# Patient Record
Sex: Female | Born: 1965 | Race: Black or African American | Hispanic: No
Health system: Midwestern US, Community
[De-identification: ages and names within clinical notes are randomized; demographics above are authoritative.]

## PROBLEM LIST (undated history)

## (undated) DIAGNOSIS — I10 Essential (primary) hypertension: Secondary | ICD-10-CM

## (undated) HISTORY — PX: HAND TENDON SURGERY: SHX663

## (undated) HISTORY — PX: TUBAL LIGATION: SHX77

---

## 2016-07-05 ENCOUNTER — Other Ambulatory Visit (HOSPITAL_BASED_OUTPATIENT_CLINIC_OR_DEPARTMENT_OTHER): Payer: Self-pay

## 2016-07-05 DIAGNOSIS — R0683 Snoring: Secondary | ICD-10-CM

## 2016-07-05 DIAGNOSIS — R5383 Other fatigue: Secondary | ICD-10-CM

## 2016-08-28 ENCOUNTER — Encounter (HOSPITAL_COMMUNITY): Payer: Self-pay | Admitting: Family Medicine

## 2016-08-28 ENCOUNTER — Ambulatory Visit (HOSPITAL_COMMUNITY)
Admission: EM | Admit: 2016-08-28 | Discharge: 2016-08-28 | Disposition: A | Discharge status: Home / Self Care | Payer: Managed Care, Other (non HMO) | Attending: Family Medicine | Admitting: Family Medicine

## 2016-08-28 DIAGNOSIS — K047 Periapical abscess without sinus: Secondary | ICD-10-CM

## 2016-08-28 DIAGNOSIS — R0981 Nasal congestion: Secondary | ICD-10-CM | POA: Diagnosis not present

## 2016-08-28 HISTORY — DX: Essential (primary) hypertension: I10

## 2016-08-28 MED ORDER — FLUCONAZOLE 150 MG PO TABS: 150.0000 mg | ORAL_TABLET | Freq: Once | ORAL | 0 refills | Status: AC

## 2016-08-28 MED ORDER — CLINDAMYCIN HCL 150 MG PO CAPS: 150.0000 mg | ORAL_CAPSULE | Freq: Three times a day (TID) | ORAL | 0 refills | Status: DC

## 2016-08-28 NOTE — ED Provider Notes (Signed)
[facility]    CSN: [id number] Arrival date & time: 08/28/16  1936     History   Chief Complaint Chief Complaint  Patient presents with  . Nasal Congestion    HPI Isabel Pierce is a 51 y.o. female.   [name]      Past Medical History:  Diagnosis Date  . Hypertension     There are no active problems to display for this patient.   Past Surgical History:  Procedure Laterality Date  . TUBAL LIGATION      OB History    No data available       Home Medications    Prior to Admission medications   Medication Sig Start Date End Date Taking? Authorizing Provider  clindamycin (CLEOCIN) 150 MG capsule Take 1 capsule (150 mg total) by mouth 3 (three) times daily. 08/28/16   Elvina Sidle, MD    Family History History reviewed. No pertinent family history.  Social History Social History  Substance Use Topics  . Smoking status: Not on file  . Smokeless tobacco: Not on file  . Alcohol use Not on file     Allergies   Augmentin [amoxicillin-pot clavulanate]   Review of Systems Review of Systems   Physical Exam Triage Vital Signs ED Triage Vitals [08/28/16 2029]  Enc Vitals Group     BP 124/78     Pulse Rate 78     Resp 18     Temp 98.6 F (37 C)     Temp src      SpO2 100 %     Weight      Height      Head Circumference      Peak Flow      Pain Score      Pain Loc      Pain Edu?      Excl. in GC?    No data found.   Updated Vital Signs BP 124/78   Pulse 78   Temp 98.6 F (37 C)   Resp 18   SpO2 100%      Physical Exam  Constitutional: She is oriented to person, place, and time. She appears well-developed and well-nourished.  HENT:  Right Ear: External ear normal.  Left Ear: External ear normal.  At the site of tooth #11 patient has a cavity in the gingiva that is filled with yellow pus. A similar colored adherent pus overlies  the gingiva on the roof of the palate on the left side adjacent to teeth #11 through 14  Eyes: Pupils are equal, round, and reactive to light. Conjunctivae are normal.  Neck: Normal range of motion.  Pulmonary/Chest: Effort normal.  Musculoskeletal: Normal range of motion.  Neurological: She is alert and oriented to person, place, and time.  Skin: Skin is warm and dry.  Nursing note and vitals reviewed.    UC Treatments / Results  Labs (all labs ordered are listed, but only abnormal results are displayed) Labs Reviewed - No data to display  EKG  EKG Interpretation None       Radiology No results found.  Procedures Procedures (including critical care time)  Medications Ordered in UC Medications - No data to display   Initial Impression / Assessment and Plan / UC Course  I have reviewed the triage vital signs and the nursing notes.  Pertinent labs & imaging results that were available during my care  of the patient were reviewed by me and considered in my medical decision making (see chart for details).     Final Clinical Impressions(s) / UC Diagnoses   Final diagnoses:  Dental infection  Sinus congestion    New Prescriptions New Prescriptions   CLINDAMYCIN (CLEOCIN) 150 MG CAPSULE    Take 1 capsule (150 mg total) by mouth 3 (three) times daily.     Controlled Substance Prescriptions [place] Controlled Substance Registry consulted? Not Applicable   Elvina SidleLauenstein, Amala Petion, MD 08/28/16 2109

## 2016-08-28 NOTE — ED Triage Notes (Signed)
Pt here for congestion and possible sinus infection.

## 2016-08-28 NOTE — Discharge Instructions (Signed)
You appear to have a dental infection at the site of the dental extraction as well as a palate infection of the same bacterial source. I would like you to call your dentist and make sure he extracted the entire root of the tooth, and follow-up with the dentist if there is any question of retained root.  Please return here or your dentist if symptoms persist

## 2017-03-02 ENCOUNTER — Ambulatory Visit (HOSPITAL_COMMUNITY)
Admission: EM | Admit: 2017-03-02 | Discharge: 2017-03-02 | Disposition: A | Discharge status: Home / Self Care | Payer: Managed Care, Other (non HMO) | Attending: Family Medicine | Admitting: Family Medicine

## 2017-03-02 ENCOUNTER — Telehealth (HOSPITAL_COMMUNITY): Payer: Self-pay | Admitting: Emergency Medicine

## 2017-03-02 ENCOUNTER — Encounter (HOSPITAL_COMMUNITY): Payer: Self-pay | Admitting: Emergency Medicine

## 2017-03-02 ENCOUNTER — Other Ambulatory Visit: Payer: Self-pay

## 2017-03-02 DIAGNOSIS — H9202 Otalgia, left ear: Secondary | ICD-10-CM

## 2017-03-02 DIAGNOSIS — K0889 Other specified disorders of teeth and supporting structures: Secondary | ICD-10-CM

## 2017-03-02 MED ORDER — FLUCONAZOLE 150 MG PO TABS: 150.0000 mg | ORAL_TABLET | Freq: Every day | ORAL | 1 refills | Status: DC

## 2017-03-02 MED ORDER — CLINDAMYCIN HCL 300 MG PO CAPS: 300.0000 mg | ORAL_CAPSULE | Freq: Three times a day (TID) | ORAL | 0 refills | Status: DC

## 2017-03-02 NOTE — ED Triage Notes (Signed)
Pt c/o pain in her L ear, with sharp pains in the L side of her upper jaw. Pt states shes had a tooth extracted and its hurting in the same area.

## 2017-03-09 NOTE — ED Provider Notes (Signed)
  [facility]   [id number] 03/02/17 Arrival Time: 1448  ASSESSMENT & PLAN:  1. Left ear pain   2. Pain, dental     Meds ordered this encounter  Medications  . clindamycin (CLEOCIN) 300 MG capsule    Sig: Take 1 capsule (300 mg total) by mouth 3 (three) times daily.    Dispense:  30 capsule    Refill:  0   Prefers OTC analgesics. Will schedule f/u with her dentist. May f/u here as needed.  Reviewed expectations re: course of current medical issues. Questions answered. Outlined signs and symptoms indicating need for more acute intervention. Patient verbalized understanding. After Visit Summary given.   SUBJECTIVE:  Isabel Pierce is a 52 y.o. female who reports gradual onset of left upper dental pain. Present for several days. Afebrile. Tolerating PO intake but reports pain with chewing. Normal swallowing. She does see a dentist regularly. No neck swelling or pain. OTC analgesics with some relief. Also with L ear discomfort. Occasional sharp pain. No drainage or bleeding. No fever reported.  ROS: As per HPI.  OBJECTIVE:  Vitals:   03/02/17 1547  BP: (!) 134/97  Pulse: 68  Resp: 18  Temp: 100.2 F (37.9 C)  SpO2: 100%    General appearance: alert; no distress HENT: normocephalic; atraumatic; dentition: fair; gingival hypertrophy over left upper gums; no areas of fluctuance; bilateral TMs appear normal Neck: supple without LAD Lungs: normal respirations Skin: warm and dry Psychological: alert and cooperative; normal mood and affect  Allergies  Allergen Reactions  . Augmentin [Amoxicillin-Pot Clavulanate]     Diarrhea   . Contrast Media [Iodinated Diagnostic Agents]     hives  . Lisinopril     cough    Past Medical History:  Diagnosis Date  . Hypertension    Social History   Socioeconomic History  . Marital status: Unknown    Spouse name: Not on file  . Number of children: Not on file  . Years of education: Not on file  . Highest education  level: Not on file  Social Needs  . Financial resource strain: Not on file  . Food insecurity - worry: Not on file  . Food insecurity - inability: Not on file  . Transportation needs - medical: Not on file  . Transportation needs - non-medical: Not on file  Occupational History  . Not on file  Tobacco Use  . Smoking status: Not on file  Substance and Sexual Activity  . Alcohol use: Not on file  . Drug use: Not on file  . Sexual activity: Not on file  Other Topics Concern  . Not on file  Social History Narrative  . Not on file    Past Surgical History:  Procedure Laterality Date  . TUBAL LIGATION       Mardella LaymanHagler, Adwoa Axe, MD 03/09/17 [phone]

## 2017-10-12 ENCOUNTER — Encounter (HOSPITAL_COMMUNITY): Payer: Self-pay | Admitting: Emergency Medicine

## 2017-10-12 ENCOUNTER — Ambulatory Visit (HOSPITAL_COMMUNITY)
Admission: EM | Admit: 2017-10-12 | Discharge: 2017-10-12 | Disposition: A | Discharge status: Home / Self Care | Payer: Managed Care, Other (non HMO) | Attending: Family Medicine | Admitting: Family Medicine

## 2017-10-12 ENCOUNTER — Other Ambulatory Visit: Payer: Self-pay

## 2017-10-12 DIAGNOSIS — Z79899 Other long term (current) drug therapy: Secondary | ICD-10-CM | POA: Diagnosis not present

## 2017-10-12 DIAGNOSIS — Z880 Allergy status to penicillin: Secondary | ICD-10-CM | POA: Diagnosis not present

## 2017-10-12 DIAGNOSIS — R002 Palpitations: Secondary | ICD-10-CM | POA: Diagnosis present

## 2017-10-12 DIAGNOSIS — E669 Obesity, unspecified: Secondary | ICD-10-CM | POA: Insufficient documentation

## 2017-10-12 DIAGNOSIS — I10 Essential (primary) hypertension: Secondary | ICD-10-CM | POA: Diagnosis not present

## 2017-10-12 LAB — POCT I-STAT, CHEM 8
BUN: 17 mg/dL (ref 6–20)
CHLORIDE: 100 mmol/L (ref 98–111)
Calcium, Ion: 1.18 mmol/L (ref 1.15–1.40)
Creatinine, Ser: 0.9 mg/dL (ref 0.44–1.00)
GLUCOSE: 95 mg/dL (ref 70–99)
HEMATOCRIT: 39 % (ref 36.0–46.0)
HEMOGLOBIN: 13.3 g/dL (ref 12.0–15.0)
POTASSIUM: 3.6 mmol/L (ref 3.5–5.1)
Sodium: 138 mmol/L (ref 135–145)
TCO2: 27 mmol/L (ref 22–32)

## 2017-10-12 LAB — TSH: TSH: 2.259 u[IU]/mL (ref 0.350–4.500)

## 2017-10-12 NOTE — Discharge Instructions (Signed)
EKG and blood work did not show concerning features Thyroid test ordered.  We will follow up with you if abnormal results Follow up with cardiologist tomorrow for further evaluation and management of palpitation Go to the ED if you have have any new or worsening symptoms such as chest pain, shortness of breath, nausea, vomiting, lightheadedness, dizziness, abdominal pain, jaw or arm numbness, etc..Marland Kitchen

## 2017-10-12 NOTE — ED Triage Notes (Addendum)
Pt reports a history of palpitations that started in June.  Pt had an echo and was seen by a cardiologist with no findings.  Pt states she started having palpitations again yesterday but she states that they feel different this time.  Pt denies any CP or SOB.

## 2017-10-12 NOTE — ED Provider Notes (Signed)
[facility]   [id number] 10/12/17 Arrival Time: 1836   CC: Palpitations  SUBJECTIVE:  Isabel Pierce is a 52 y.o. female hx of HTN, and obesity who presents with complaint of palpitations that began yesterday and today.  Reports similar symptoms in June of this year and was seen by a cardiologist.  ECHO was performed at that time and was normal.  Patient states palpitations lasts for a few minutes at a time and occur approximately 5x a day.  Denies a precipitating event, or known trigger.  Denies alleviating or aggravating factors.  Denies chest pain, SOB, dizziness, lightheadedness, nausea, vomiting, swelling in lower extremities, and abdominal pain.    ROS: As per HPI. Past Medical History:  Diagnosis Date  . Hypertension    Past Surgical History:  Procedure Laterality Date  . TUBAL LIGATION     Allergies  Allergen Reactions  . Augmentin [Amoxicillin-Pot Clavulanate]     Diarrhea   . Contrast Media [Iodinated Diagnostic Agents]     hives  . Lisinopril     cough   No current facility-administered medications on file prior to encounter.    Current Outpatient Medications on File Prior to Encounter  Medication Sig Dispense Refill  . amLODipine (NORVASC) 5 MG tablet Take 5 mg by mouth daily.    . carvedilol (COREG) 12.5 MG tablet Take 12.5 mg by mouth 2 (two) times daily with a meal.    . hydrochlorothiazide (HYDRODIURIL) 12.5 MG tablet Take 12.5 mg by mouth daily.    . ranitidine (ZANTAC) 150 MG tablet Take 150 mg by mouth 2 (two) times daily.     Social History   Socioeconomic History  . Marital status: Married    Spouse name: Not on file  . Number of children: Not on file  . Years of education: Not on file  . Highest education level: Not on file  Occupational History  . Not on file  Social Needs  . Financial resource strain: Not on file  . Food insecurity:    Worry: Not on file    Inability: Not on file  . Transportation needs:    Medical: Not on file      Non-medical: Not on file  Tobacco Use  . Smoking status: Not on file  Substance and Sexual Activity  . Alcohol use: Not on file  . Drug use: Not on file  . Sexual activity: Not on file  Lifestyle  . Physical activity:    Days per week: Not on file    Minutes per session: Not on file  . Stress: Not on file  Relationships  . Social connections:    Talks on phone: Not on file    Gets together: Not on file    Attends religious service: Not on file    Active member of club or organization: Not on file    Attends meetings of clubs or organizations: Not on file    Relationship status: Not on file  . Intimate partner violence:    Fear of current or ex partner: Not on file    Emotionally abused: Not on file    Physically abused: Not on file    Forced sexual activity: Not on file  Other Topics Concern  . Not on file  Social History Narrative  . Not on file   History reviewed. No pertinent family history.   OBJECTIVE:  Vitals:   10/12/17 1852  BP: (!) 141/75  Pulse: 67  Temp: 98.2 F (36.8 C)  TempSrc: Oral  SpO2: 100%    General appearance: alert; no distress Eyes: PERRLA; EOMI; conjunctiva normal HENT: normocephalic; atraumatic Neck: supple Lungs: clear to auscultation bilaterally without adventitious breath sounds Heart: regular rate and rhythm.  Clear S1 and S2 without rubs, gallops, or murmur. Chest Wall:nontender with AP compression; no heaves, lifts, or thrills Abdomen: soft, non-tender; bowel sounds normal; no masses or organomegaly; no guarding Extremities: no cyanosis or edema; symmetrical with no gross deformities Skin: warm and dry Psychological: alert and cooperative; normal mood and affect  ECG: Orders placed or performed during the hospital encounter of 10/12/17  . ED EKG  . ED EKG   EKG showed sinus arrhythm without ST elevations, depressions, or prolonged PR interval.  No narrowing or widening of the QRS complexes.  Reviewed EKG with Dr. Tracie Harrier.   No past EKG on file for comparison.  Patient was not actively having symptoms when EKG performed.    Labs: Results for orders placed or performed during the hospital encounter of 10/12/17  I-STAT, chem 8  Result Value Ref Range   Sodium 138 135 - 145 mmol/L   Potassium 3.6 3.5 - 5.1 mmol/L   Chloride 100 98 - 111 mmol/L   BUN 17 6 - 20 mg/dL   Creatinine, Ser 1.61 0.44 - 1.00 mg/dL   Glucose, Bld 95 70 - 99 mg/dL   Calcium, Ion 0.96 0.45 - 1.40 mmol/L   TCO2 27 22 - 32 mmol/L   Hemoglobin 13.3 12.0 - 15.0 g/dL   HCT 40.9 81.1 - 91.4 %   Labs Reviewed  TSH  POCT I-STAT, CHEM 8    ASSESSMENT & PLAN:  1. Palpitations     No orders of the defined types were placed in this encounter.  EKG and blood work did not show concerning features Thyroid test ordered.  We will follow up with you if abnormal results Follow up with cardiologist tomorrow for further evaluation and management of palpitation Go to the ED if you have have any new or worsening symptoms such as chest pain, shortness of breath, nausea, vomiting, lightheadedness, dizziness, abdominal pain, jaw or arm numbness, etc...  Chest pain precautions given. Reviewed expectations re: course of current medical issues. Questions answered. Outlined signs and symptoms indicating need for more acute intervention. Patient verbalized understanding. After Visit Summary given.   Rennis Harding, PA-C 10/12/17 1945

## 2018-05-19 ENCOUNTER — Ambulatory Visit (INDEPENDENT_AMBULATORY_CARE_PROVIDER_SITE_OTHER): Payer: Managed Care, Other (non HMO)

## 2018-05-19 ENCOUNTER — Other Ambulatory Visit: Payer: Self-pay

## 2018-05-19 ENCOUNTER — Encounter (HOSPITAL_COMMUNITY): Payer: Self-pay | Admitting: Emergency Medicine

## 2018-05-19 ENCOUNTER — Ambulatory Visit (HOSPITAL_COMMUNITY)
Admission: EM | Admit: 2018-05-19 | Discharge: 2018-05-19 | Disposition: A | Discharge status: Home / Self Care | Payer: Managed Care, Other (non HMO) | Attending: Family Medicine | Admitting: Family Medicine

## 2018-05-19 DIAGNOSIS — R0602 Shortness of breath: Secondary | ICD-10-CM | POA: Diagnosis not present

## 2018-05-19 DIAGNOSIS — T7840XA Allergy, unspecified, initial encounter: Secondary | ICD-10-CM | POA: Diagnosis not present

## 2018-05-19 NOTE — ED Triage Notes (Signed)
Pt sts SOB x 3 days; pt sts some sneezing and sinus congestion; denies cough

## 2018-05-19 NOTE — Discharge Instructions (Addendum)
Your chest x-ray and EKG were normal Some of the symptoms you are experiencing could be from allergies I am not seeing anything concerning on exam.  Your  vital signs are stable. Continue taking the Flonase daily and I would recommend adding in a Zyrtec daily If your symptoms worsen to include more shortness of breath, chest pain you need to go to the ER for further evaluation.

## 2018-05-19 NOTE — ED Provider Notes (Signed)
[facility]    CSN: [id number] Arrival date & time: 05/19/18  1108     History   Chief Complaint Chief Complaint  Patient presents with  . Shortness of Breath    HPI Isabel Pierce is a 53 y.o. female.   Patient is a 53 year old female with past medical history of hypertension.  She presents today with approximately 3 days of shortness of breath.  The shortness of breath is worse when she has been talking for extended periods of time.  She is also had some sneezing and sinus congestion.  Denies any associated cough, chest congestion or fevers.  She takes Flonase daily for her allergies.  Patient denies any history of heart or lung issues.  Denies any history of PE, DVT, hormone use, recent long distance traveling.  No lower extremity edema.  Denies any recent sick contacts or recent traveling.  ROS per HPI      Past Medical History:  Diagnosis Date  . Hypertension     There are no active problems to display for this patient.   Past Surgical History:  Procedure Laterality Date  . TUBAL LIGATION      OB History   No obstetric history on file.      Home Medications    Prior to Admission medications   Medication Sig Start Date End Date Taking? Authorizing Provider  amLODipine (NORVASC) 5 MG tablet Take 5 mg by mouth daily.    [provider]  carvedilol (COREG) 12.5 MG tablet Take 12.5 mg by mouth 2 (two) times daily with a meal.    [provider]  hydrochlorothiazide (HYDRODIURIL) 12.5 MG tablet Take 12.5 mg by mouth daily.    [provider]  ranitidine (ZANTAC) 150 MG tablet Take 150 mg by mouth 2 (two) times daily.    [provider]    Family History History reviewed. No pertinent family history.  Social History Social History   Tobacco Use  . Smoking status: Not on file  Substance Use Topics  . Alcohol use: Not on file  . Drug use: Not on file     Allergies   Augmentin [amoxicillin-pot clavulanate];  Contrast media [iodinated diagnostic agents]; and Lisinopril   Review of Systems Review of Systems   Physical Exam Triage Vital Signs ED Triage Vitals  Enc Vitals Group     BP 05/19/18 1127 139/90     Pulse Rate 05/19/18 1127 64     Resp 05/19/18 1127 18     Temp 05/19/18 1127 98.4 F (36.9 C)     Temp Source 05/19/18 1127 Oral     SpO2 05/19/18 1127 99 %     Weight --      Height --      Head Circumference --      Peak Flow --      Pain Score 05/19/18 1128 0     Pain Loc --      Pain Edu? --      Excl. in GC? --    No data found.  Updated Vital Signs BP 139/90 (BP Location: Right Arm)   Pulse 64   Temp 98.4 F (36.9 C) (Oral)   Resp 18   SpO2 99%   Visual Acuity Right Eye Distance:   Left Eye Distance:   Bilateral Distance:    Right Eye Near:   Left Eye Near:    Bilateral Near:     Physical Exam Vitals signs and nursing note reviewed.  Constitutional:      General: She is not in acute distress.    Appearance: She is well-developed. She is not ill-appearing, toxic-appearing or diaphoretic.  HENT:     Head: Normocephalic and atraumatic.     Mouth/Throat:     Pharynx: Oropharynx is clear.  Neck:     Musculoskeletal: Normal range of motion.  Cardiovascular:     Rate and Rhythm: Normal rate and regular rhythm.  Pulmonary:     Effort: Pulmonary effort is normal.     Breath sounds: Normal breath sounds.  Musculoskeletal: Normal range of motion.     Right lower leg: She exhibits no tenderness. No edema.     Left lower leg: She exhibits no tenderness. No edema.  Skin:    General: Skin is warm and dry.  Neurological:     Mental Status: She is alert.  Psychiatric:        Mood and Affect: Mood normal.      UC Treatments / Results  Labs (all labs ordered are listed, but only abnormal results are displayed) Labs Reviewed - No data to display  EKG None  Radiology Dg Chest 2 View  Result Date: 05/19/2018 CLINICAL DATA:  Shortness of breath. EXAM:  CHEST - 2 VIEW COMPARISON:  None. FINDINGS: The heart size and mediastinal contours are within normal limits. Both lungs are clear. The visualized skeletal structures are unremarkable. IMPRESSION: Normal exam. Electronically Signed   By: Francene Boyers M.D.   On: 05/19/2018 12:17    Procedures Procedures (including critical care time)  Medications Ordered in UC Medications - No data to display  Initial Impression / Assessment and Plan / UC Course  I have reviewed the triage vital signs and the nursing notes.  Pertinent labs & imaging results that were available during my care of the patient were reviewed by me and considered in my medical decision making (see chart for details).     Patient is a 53 year old female with a past medical history of hypertension. She is presenting today with shortness of breath upon talking for long periods of time.  She does have a history of allergies and is also had some sinus congestion and sneezing. She is denying any other concerning symptoms. Exam is normal.  Vital signs are stable and she is nontoxic or ill-appearing. Chest x-ray was normal and EKG revealed sinus bradycardia with arrhythmia.  Patient is on beta-blocker. Normal saturations. Patient symptoms could be due to allergies. We will have her continue her Flonase daily and add Zyrtec daily for symptoms Strict precautions and instructions that if her symptoms continue or worsen she will need to go to the hospital for further evaluation and management There is no concern today for ACS, DVT, PE or any other concerning etiology. Patient understanding and agree. Final Clinical Impressions(s) / UC Diagnoses   Final diagnoses:  Shortness of breath  Allergic state, initial encounter     Discharge Instructions     Your chest x-ray and EKG were normal Some of the symptoms you are experiencing could be from allergies I am not seeing anything concerning on exam.  Your  vital signs are stable.  Continue taking the Flonase daily and I would recommend adding in a Zyrtec daily If your symptoms worsen to include more shortness of breath, chest pain you need to go to the ER for further evaluation.     ED Prescriptions    None     Controlled Substance Prescriptions [place] Controlled Substance Registry  consulted? Not Applicable   Janace ArisBast, Vedanshi Massaro A, NP 05/19/18 1236

## 2018-10-18 ENCOUNTER — Ambulatory Visit
Admission: RE | Admit: 2018-10-18 | Discharge: 2018-10-18 | Disposition: A | Discharge status: Home / Self Care | Payer: Managed Care, Other (non HMO) | Source: Ambulatory Visit | Attending: Physician Assistant | Admitting: Physician Assistant

## 2018-10-18 ENCOUNTER — Other Ambulatory Visit: Payer: Self-pay | Admitting: Physician Assistant

## 2018-10-18 DIAGNOSIS — R1084 Generalized abdominal pain: Secondary | ICD-10-CM

## 2018-10-18 DIAGNOSIS — K5900 Constipation, unspecified: Secondary | ICD-10-CM

## 2018-11-29 ENCOUNTER — Other Ambulatory Visit: Payer: Self-pay

## 2018-11-29 ENCOUNTER — Encounter (HOSPITAL_COMMUNITY): Payer: Self-pay

## 2018-11-29 ENCOUNTER — Ambulatory Visit (HOSPITAL_COMMUNITY)
Admission: EM | Admit: 2018-11-29 | Discharge: 2018-11-29 | Disposition: A | Discharge status: Home / Self Care | Payer: Managed Care, Other (non HMO) | Attending: Family Medicine | Admitting: Family Medicine

## 2018-11-29 DIAGNOSIS — M79601 Pain in right arm: Secondary | ICD-10-CM | POA: Diagnosis not present

## 2018-11-29 DIAGNOSIS — R202 Paresthesia of skin: Secondary | ICD-10-CM

## 2018-11-29 DIAGNOSIS — M25511 Pain in right shoulder: Secondary | ICD-10-CM

## 2018-11-29 MED ORDER — MELOXICAM 7.5 MG PO TABS: 7.5000 mg | ORAL_TABLET | Freq: Every day | ORAL | 0 refills | Status: DC

## 2018-11-29 MED ORDER — PREDNISONE 10 MG (21) PO TBPK: ORAL_TABLET | Freq: Every day | ORAL | 0 refills | Status: DC

## 2018-11-29 NOTE — ED Triage Notes (Signed)
Pt presents to UC w/ c/o right arm weakness, numbness, and pain x2 weeks. Pt states she uses her right hand to type a lot. Strength is equal in both arms.

## 2018-11-30 NOTE — ED Provider Notes (Signed)
[facility]   [id number] 11/29/18 Arrival Time: 1332  ASSESSMENT & PLAN:  1. Right arm pain   2. Acute pain of right shoulder   3. Arm paresthesia, right     No indication for plain imaging at this time. Discussed.  Begin trial of: Meds ordered this encounter  Medications  . predniSONE (STERAPRED UNI-PAK 21 TAB) 10 MG (21) TBPK tablet    Sig: Take by mouth daily. Take as directed.    Dispense:  21 tablet    Refill:  0  . meloxicam (MOBIC) 7.5 MG tablet    Sig: Take 1 tablet (7.5 mg total) by mouth daily.    Dispense:  14 tablet    Refill:  0  Encourage ROM as tolerated.  Recommend: Follow-up Information    [facility].   Why: If worsening or failing to improve as anticipated. Contact information: [address] [place] [address] [phone]          Reviewed expectations re: course of current medical issues. Questions answered. Outlined signs and symptoms indicating need for more acute intervention. Patient verbalized understanding. After Visit Summary given.  SUBJECTIVE: History from: patient. Isabel Pierce is a 53 y.o. female who reports intermittent mild to moderate pain of her right arm and shoulder; described as aching; without radiation. Onset: gradual. First noted: two w ago. Injury/trama: no. Symptoms have waxed and waned but are worse overall since beginning. Aggravating factors: certain movements and grasping. Alleviating factors: rest. Associated symptoms: none reported. Extremity sensation changes or weakness: occasional "tingling" in arm over elbow and forearm; none in wrist/hand. Self treatment: acetaminophen without much relief. History of similar: no.  Past Surgical History:  Procedure Laterality Date  . TUBAL LIGATION       ROS: As per HPI. All other systems negative.    OBJECTIVE:  Vitals:   11/29/18 1412  BP: (!) 141/71  Pulse: (!) 55  Resp: 16  Temp: 98.3 F  (36.8 C)  TempSrc: Oral  SpO2: 98%    General appearance: alert; no distress HEENT: [place]; AT Neck: supple with FROM; no midline TTP CV: bradycardia (her baseline); regular Resp: unlabored respirations Extremities: . RUE: warm with well perfused appearance; poorly localized mild tenderness over right shoulder without bony tenderness; without gross deformities; swelling: none; bruising: none; shoulder and elbow ROM: normal without reported discomfort CV: brisk extremity capillary refill of bilateral UE; 2+ radial pulse of bilateral UE. Skin: warm and dry; no visible rashes Neurologic: gait normal; normal reflexes of bilateral UE; normal sensation of bilateral UE; normal strength of bilateral UE Psychological: alert and cooperative; normal mood and affect    Allergies  Allergen Reactions  . Augmentin [Amoxicillin-Pot Clavulanate]     Diarrhea   . Contrast Media [Iodinated Diagnostic Agents]     hives  . Lisinopril     cough    Past Medical History:  Diagnosis Date  . Hypertension    Social History   Socioeconomic History  . Marital status: Married    Spouse name: Not on file  . Number of children: Not on file  . Years of education: Not on file  . Highest education level: Not on file  Occupational History  . Not on file  Social Needs  . Financial resource strain: Not on file  . Food insecurity    Worry: Not on file    Inability: Not on file  . Transportation needs    Medical: Not on  file    Non-medical: Not on file  Tobacco Use  . Smoking status: Never Smoker  . Smokeless tobacco: Never Used  Substance and Sexual Activity  . Alcohol use: Yes    Frequency: Never    Comment: occ  . Drug use: Never  . Sexual activity: Not on file  Lifestyle  . Physical activity    Days per week: Not on file    Minutes per session: Not on file  . Stress: Not on file  Relationships  . Social Herbalist on phone: Not on file    Gets together: Not on file    Attends  religious service: Not on file    Active member of club or organization: Not on file    Attends meetings of clubs or organizations: Not on file    Relationship status: Not on file  Other Topics Concern  . Not on file  Social History Narrative  . Not on file   Family History  Problem Relation Age of Onset  . Diabetes Mother   . Hypertension Mother   . Seizures Mother   . Cancer Father    Past Surgical History:  Procedure Laterality Date  . TUBAL LIGATION        Vanessa Kick, MD 11/30/18 1007

## 2019-05-16 ENCOUNTER — Ambulatory Visit
Admission: EM | Admit: 2019-05-16 | Discharge: 2019-05-16 | Disposition: A | Discharge status: Home / Self Care | Payer: Managed Care, Other (non HMO) | Attending: Emergency Medicine | Admitting: Emergency Medicine

## 2019-05-16 ENCOUNTER — Other Ambulatory Visit: Payer: Self-pay

## 2019-05-16 ENCOUNTER — Encounter: Payer: Self-pay | Admitting: Emergency Medicine

## 2019-05-16 DIAGNOSIS — N644 Mastodynia: Secondary | ICD-10-CM | POA: Diagnosis not present

## 2019-05-16 DIAGNOSIS — M5442 Lumbago with sciatica, left side: Secondary | ICD-10-CM

## 2019-05-16 DIAGNOSIS — M5441 Lumbago with sciatica, right side: Secondary | ICD-10-CM

## 2019-05-16 MED ORDER — NAPROXEN 500 MG PO TABS: 500.0000 mg | ORAL_TABLET | Freq: Two times a day (BID) | ORAL | 0 refills | Status: AC

## 2019-05-16 NOTE — ED Provider Notes (Addendum)
[name]    CSN: [id number] Arrival date & time: 05/16/19  1242      History   Chief Complaint Chief Complaint  Patient presents with  . Back Pain    HPI Isabel Pierce is a 54 y.o. female with history of hypertension, obesity presenting for evaluation of right-sided low back pain.  States that sometimes she will get radiation down both legs.  Prolonged standing and certain movements exacerbate this.  Denies recent fall or trauma.  No change in bowel or bladder habit.  Denying fever, chest pain, difficulty breathing.  Has taking single dose Aleve with some relief. Patient also notes chronic right breast pain.  Patient states she does feel 2 lumps in affected breast, anteromedial aspect.  No change in appearance of breast, nipple, nipple discharge or inversion.  Denies personal history of breast cancer.  Believes her cousin and sister may have had it.  Last mammogram was in July 2020: Reportedly normal.  Followed routinely by her PCP for health screenings.  No change in appetite, weight.   Past Medical History:  Diagnosis Date  . Hypertension     There are no problems to display for this patient.   Past Surgical History:  Procedure Laterality Date  . TUBAL LIGATION      OB History   No obstetric history on file.      Home Medications    Prior to Admission medications   Medication Sig Start Date End Date Taking? Authorizing Provider  amLODipine (NORVASC) 5 MG tablet Take 5 mg by mouth daily.    [provider]  carvedilol (COREG) 12.5 MG tablet Take 12.5 mg by mouth 2 (two) times daily with a meal.    [provider]  fluticasone (FLONASE) 50 MCG/ACT nasal spray Place into both nostrils daily.    [provider]  hydrochlorothiazide (HYDRODIURIL) 12.5 MG tablet Take 12.5 mg by mouth daily.    [provider]  ibuprofen (ADVIL) 800 MG tablet Take 800 mg by mouth every 8 (eight) hours as needed.    [provider]    naproxen (NAPROSYN) 500 MG tablet Take 1 tablet (500 mg total) by mouth 2 (two) times daily. 05/16/19   Hall-Potvin, [place], PA-C  ranitidine (ZANTAC) 150 MG tablet Take 150 mg by mouth 2 (two) times daily.  11/29/18  [provider]    Family History Family History  Problem Relation Age of Onset  . Diabetes Mother   . Hypertension Mother   . Seizures Mother   . Cancer Father     Social History Social History   Tobacco Use  . Smoking status: Never Smoker  . Smokeless tobacco: Never Used  Substance Use Topics  . Alcohol use: Yes    Comment: occ  . Drug use: Never     Allergies   Augmentin [amoxicillin-pot clavulanate], Contrast media [iodinated diagnostic agents], and Lisinopril   Review of Systems As per HPI   Physical Exam Triage Vital Signs ED Triage Vitals [05/16/19 1301]  Enc Vitals Group     BP      Pulse      Resp      Temp      Temp src      SpO2      Weight      Height      Head Circumference      Peak Flow      Pain Score 7     Pain Loc  Pain Edu?      Excl. in GC?    No data found.  Updated Vital Signs BP (!) 148/88 (BP Location: Right Arm)   Pulse 67   Temp 98.1 F (36.7 C)   Resp 20   SpO2 95%   Visual Acuity Right Eye Distance:   Left Eye Distance:   Bilateral Distance:    Right Eye Near:   Left Eye Near:    Bilateral Near:     Physical Exam Constitutional:      General: She is not in acute distress. HENT:     Head: Normocephalic and atraumatic.  Eyes:     General: No scleral icterus.    Pupils: Pupils are equal, round, and reactive to light.  Cardiovascular:     Rate and Rhythm: Normal rate.  Pulmonary:     Effort: Pulmonary effort is normal.  Chest:     Comments: Patient declined Musculoskeletal:     Comments: Lumbar spine without bony deformity or spinous process tenderness.  Patient does not have PSIS tenderness, though does have diffuse right-sided low back pain with palpation.  No crepitus,  edema, fluctuance or spasm appreciated.  Strength 5/5 in lower extremities with 2+ DTRs.  Gait normal.  Skin:    Coloration: Skin is not jaundiced or pale.  Neurological:     Mental Status: She is alert and oriented to person, place, and time.      UC Treatments / Results  Labs (all labs ordered are listed, but only abnormal results are displayed) Labs Reviewed - No data to display  EKG   Radiology No results found.  Procedures Procedures (including critical care time)  Medications Ordered in UC Medications - No data to display  Initial Impression / Assessment and Plan / UC Course  I have reviewed the triage vital signs and the nursing notes.  Pertinent labs & imaging results that were available during my care of the patient were reviewed by me and considered in my medical decision making (see chart for details).     Patient afebrile, nontoxic in office today.  H&P consistent with acute right low back pain with sciatica.  Will increase naproxen to 500 mg twice daily x1 week.  Stretches provided, will push fluids.  Regarding right breast tenderness.  No alarm symptoms as mentioned in HPI.  Patient declined exam today if she will follow up with her PCP for possible diagnostic mammogram.  Return precautions discussed, patient verbalized understanding and is agreeable to plan. Final Clinical Impressions(s) / UC Diagnoses   Final diagnoses:  Breast pain, right  Acute right-sided low back pain with bilateral sciatica     Discharge Instructions     Take naproxen twice daily with food x1 week. Important to drink plenty of water, do low back exercises provided. Go to ER for worsening pain, numbness/tingling, inability to urinate, or inability to hold in stool. Schedule follow-up appointment with PCP for reevaluation of right breast pain as further imaging may be needed.    ED Prescriptions    Medication Sig Dispense Auth. Provider   naproxen (NAPROSYN) 500 MG tablet Take 1  tablet (500 mg total) by mouth 2 (two) times daily. 30 tablet Hall-Potvin, [place], PA-C     I have reviewed the PDMP during this encounter.   Hall-Potvin, [place], PA-C 05/16/19 1534    Hall-Potvin, [place], [place] 05/16/19 1535

## 2019-05-16 NOTE — ED Triage Notes (Signed)
Pain in right breast for several months.  pcp evaluated breast pain and said it was nothing to worry about For the last week or 2 this pain has worsened.  Described as sharp  Lower back pain and pain radiating down both legs.  This pain has been intermittent, but has worsened in the last week.  Reports burning in back and numbness and tingling in both legs

## 2019-05-16 NOTE — Discharge Instructions (Addendum)
Take naproxen twice daily with food x1 week. Important to drink plenty of water, do low back exercises provided. Go to ER for worsening pain, numbness/tingling, inability to urinate, or inability to hold in stool. Schedule follow-up appointment with PCP for reevaluation of right breast pain as further imaging may be needed.

## 2019-07-29 ENCOUNTER — Other Ambulatory Visit: Payer: Self-pay

## 2019-07-29 ENCOUNTER — Ambulatory Visit
Admission: EM | Admit: 2019-07-29 | Discharge: 2019-07-29 | Disposition: A | Discharge status: Home / Self Care | Payer: Managed Care, Other (non HMO) | Attending: Physician Assistant | Admitting: Physician Assistant

## 2019-07-29 DIAGNOSIS — Z20828 Contact with and (suspected) exposure to other viral communicable diseases: Secondary | ICD-10-CM | POA: Diagnosis not present

## 2019-07-29 DIAGNOSIS — R059 Cough, unspecified: Secondary | ICD-10-CM

## 2019-07-29 DIAGNOSIS — J3489 Other specified disorders of nose and nasal sinuses: Secondary | ICD-10-CM

## 2019-07-29 DIAGNOSIS — R0981 Nasal congestion: Secondary | ICD-10-CM | POA: Diagnosis not present

## 2019-07-29 MED ORDER — IPRATROPIUM BROMIDE 0.06 % NA SOLN: 2.0000 | Freq: Four times a day (QID) | NASAL | 0 refills | Status: AC

## 2019-07-29 MED ORDER — BENZONATATE 200 MG PO CAPS: 200.0000 mg | ORAL_CAPSULE | Freq: Three times a day (TID) | ORAL | 0 refills | Status: AC

## 2019-07-29 NOTE — Discharge Instructions (Signed)
COVID PCR testing ordered. I would like you to quarantine until testing results. Tessalon for cough. Start flonase, atrovent nasal spray for nasal congestion/drainage. You can use over the counter nasal saline rinse such as neti pot for nasal congestion. Keep hydrated, your urine should be clear to pale yellow in color. Tylenol/motrin for fever and pain. Monitor for any worsening of symptoms, chest pain, shortness of breath, wheezing, swelling of the throat, go to the emergency department for further evaluation needed.   

## 2019-07-29 NOTE — ED Provider Notes (Signed)
[name]    CSN: [id number] Arrival date & time: 07/29/19  1323      History   Chief Complaint Chief Complaint  Patient presents with  . Nasal Congestion    HPI Isabel Pierce is a 54 y.o. female.   54 year old female comes in for 2 day of URI symptoms. Nasal congestion, rhinorrhea, intermittent cough, throat irritation. Denies fever, chills, body aches. Denies abdominal pain, nausea, vomiting, diarrhea. Denies shortness of breath. Had COVID 02/2019, still with loss of taste. otc cold medicine without relief.      Past Medical History:  Diagnosis Date  . Hypertension     There are no problems to display for this patient.   Past Surgical History:  Procedure Laterality Date  . TUBAL LIGATION      OB History   No obstetric history on file.      Home Medications    Prior to Admission medications   Medication Sig Start Date End Date Taking? Authorizing Provider  amLODipine (NORVASC) 5 MG tablet Take 5 mg by mouth daily.    [provider]  benzonatate (TESSALON) 200 MG capsule Take 1 capsule (200 mg total) by mouth every 8 (eight) hours. 07/29/19   Cathie Hoops, Farzad Tibbetts V, PA-C  carvedilol (COREG) 12.5 MG tablet Take 12.5 mg by mouth 2 (two) times daily with a meal.    [provider]  fluticasone (FLONASE) 50 MCG/ACT nasal spray Place into both nostrils daily.    [provider]  hydrochlorothiazide (HYDRODIURIL) 12.5 MG tablet Take 12.5 mg by mouth daily.    [provider]  ibuprofen (ADVIL) 800 MG tablet Take 800 mg by mouth every 8 (eight) hours as needed.    [provider]  ipratropium (ATROVENT) 0.06 % nasal spray Place 2 sprays into both nostrils 4 (four) times daily. 07/29/19   Cathie Hoops, Hideko Esselman V, PA-C  naproxen (NAPROSYN) 500 MG tablet Take 1 tablet (500 mg total) by mouth 2 (two) times daily. 05/16/19   Hall-Potvin, [place], PA-C  ranitidine (ZANTAC) 150 MG tablet Take 150 mg by mouth 2 (two) times daily.  11/29/18   [provider]    Family History Family History  Problem Relation Age of Onset  . Diabetes Mother   . Hypertension Mother   . Seizures Mother   . Cancer Father     Social History Social History   Tobacco Use  . Smoking status: Never Smoker  . Smokeless tobacco: Never Used  Substance Use Topics  . Alcohol use: Yes    Comment: occ  . Drug use: Never     Allergies   Augmentin [amoxicillin-pot clavulanate], Contrast media [iodinated diagnostic agents], and Lisinopril   Review of Systems Review of Systems  Reason unable to perform ROS: See HPI as above.     Physical Exam Triage Vital Signs ED Triage Vitals  Enc Vitals Group     BP 07/29/19 1349 120/85     Pulse Rate 07/29/19 1349 85     Resp 07/29/19 1349 14     Temp 07/29/19 1349 98.3 F (36.8 C)     Temp Source 07/29/19 1349 Oral     SpO2 07/29/19 1349 96 %     Weight --      Height --      Head Circumference --      Peak Flow --      Pain Score 07/29/19 1350 0     Pain Loc --  Pain Edu? --      Excl. in GC? --    No data found.  Updated Vital Signs BP 120/85 (BP Location: Right Arm)   Pulse 85   Temp 98.3 F (36.8 C) (Oral)   Resp 14   SpO2 96%   Physical Exam Constitutional:      General: She is not in acute distress.    Appearance: Normal appearance. She is well-developed. She is not ill-appearing, toxic-appearing or diaphoretic.  HENT:     Head: Normocephalic and atraumatic.     Right Ear: Tympanic membrane, ear canal and external ear normal. Tympanic membrane is not erythematous or bulging.     Left Ear: Tympanic membrane, ear canal and external ear normal. Tympanic membrane is not erythematous or bulging.     Nose: Rhinorrhea present. No congestion. Rhinorrhea is clear.     Right Sinus: No maxillary sinus tenderness or frontal sinus tenderness.     Left Sinus: No maxillary sinus tenderness or frontal sinus tenderness.     Mouth/Throat:     Mouth: Mucous membranes are moist.      Pharynx: Oropharynx is clear. Uvula midline.  Eyes:     Conjunctiva/sclera: Conjunctivae normal.     Pupils: Pupils are equal, round, and reactive to light.  Cardiovascular:     Rate and Rhythm: Normal rate and regular rhythm.  Pulmonary:     Effort: Pulmonary effort is normal. No accessory muscle usage, prolonged expiration, respiratory distress or retractions.     Breath sounds: No decreased air movement or transmitted upper airway sounds. No decreased breath sounds.     Comments: LCTAB Musculoskeletal:     Cervical back: Normal range of motion and neck supple.  Skin:    General: Skin is warm and dry.  Neurological:     Mental Status: She is alert and oriented to person, place, and time.      UC Treatments / Results  Labs (all labs ordered are listed, but only abnormal results are displayed) Labs Reviewed  NOVEL CORONAVIRUS, NAA    EKG   Radiology No results found.  Procedures Procedures (including critical care time)  Medications Ordered in UC Medications - No data to display  Initial Impression / Assessment and Plan / UC Course  I have reviewed the triage vital signs and the nursing notes.  Pertinent labs & imaging results that were available during my care of the patient were reviewed by me and considered in my medical decision making (see chart for details).    COVID PCR test ordered. Patient to quarantine until testing results return. No alarming signs on exam.  Patient speaking in full sentences without respiratory distress.  Symptomatic treatment discussed.  Push fluids.  Return precautions given.  Patient expresses understanding and agrees to plan.  Final Clinical Impressions(s) / UC Diagnoses   Final diagnoses:  Nasal congestion  Rhinorrhea  Cough    ED Prescriptions    Medication Sig Dispense Auth. Provider   benzonatate (TESSALON) 200 MG capsule Take 1 capsule (200 mg total) by mouth every 8 (eight) hours. 21 capsule Young Mulvey V, PA-C    ipratropium (ATROVENT) 0.06 % nasal spray Place 2 sprays into both nostrils 4 (four) times daily. 15 mL Belinda Fisher, PA-C     PDMP not reviewed this encounter.   Belinda Fisher, PA-C 07/29/19 1404

## 2019-07-29 NOTE — ED Triage Notes (Signed)
Pt c/o nasal congestion and drainage (yellow), intermittent dry cough. Symptoms started yesterday.

## 2019-07-30 LAB — SARS-COV-2, NAA 2 DAY TAT

## 2019-07-30 LAB — NOVEL CORONAVIRUS, NAA: SARS-CoV-2, NAA: NOT DETECTED

## 2019-09-23 IMAGING — DX CHEST - 2 VIEW
2 series · 2 of 2 positions shown · non-contrast
Comparison: None.

CLINICAL DATA: Shortness of breath.

EXAM:
CHEST - 2 VIEW

[chest pa]
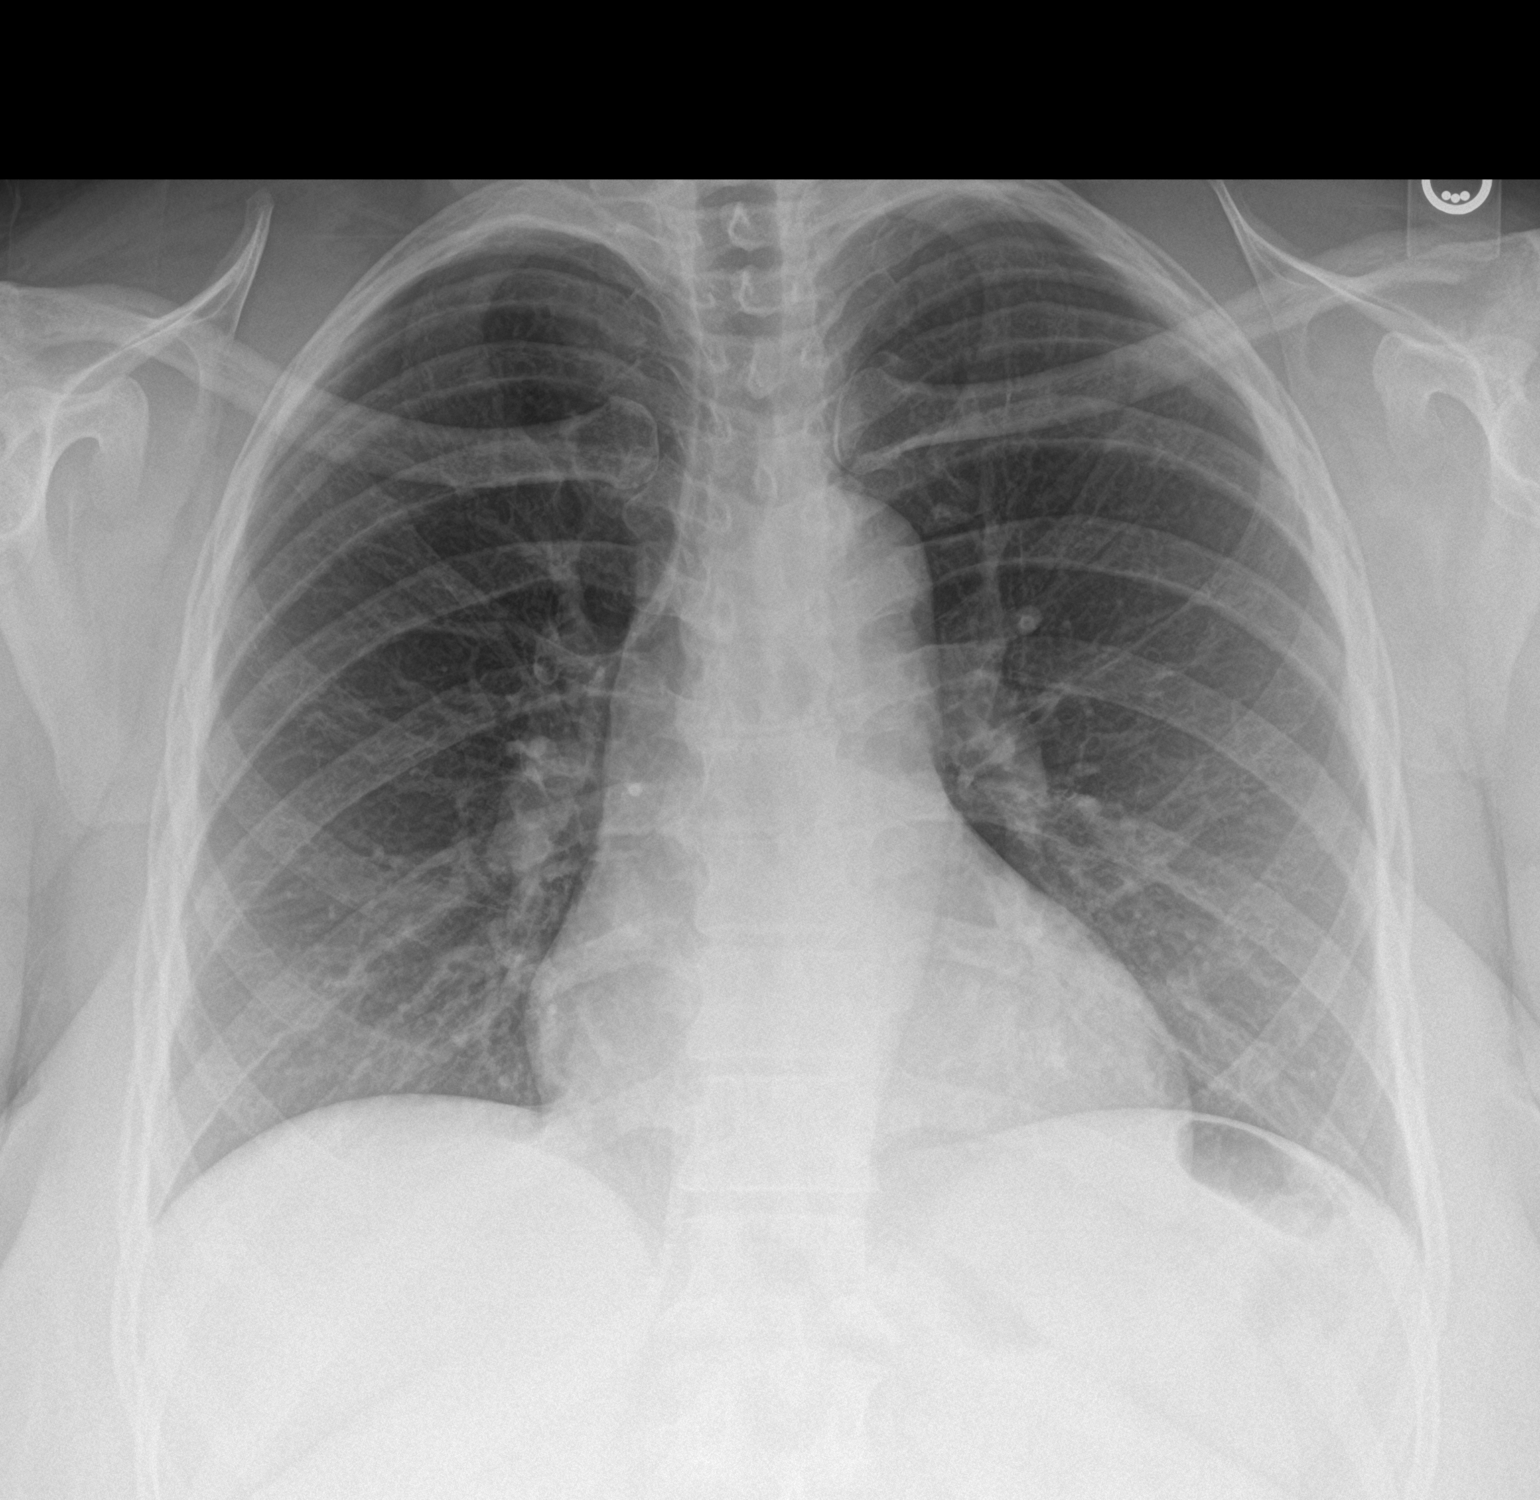

[chest lat]
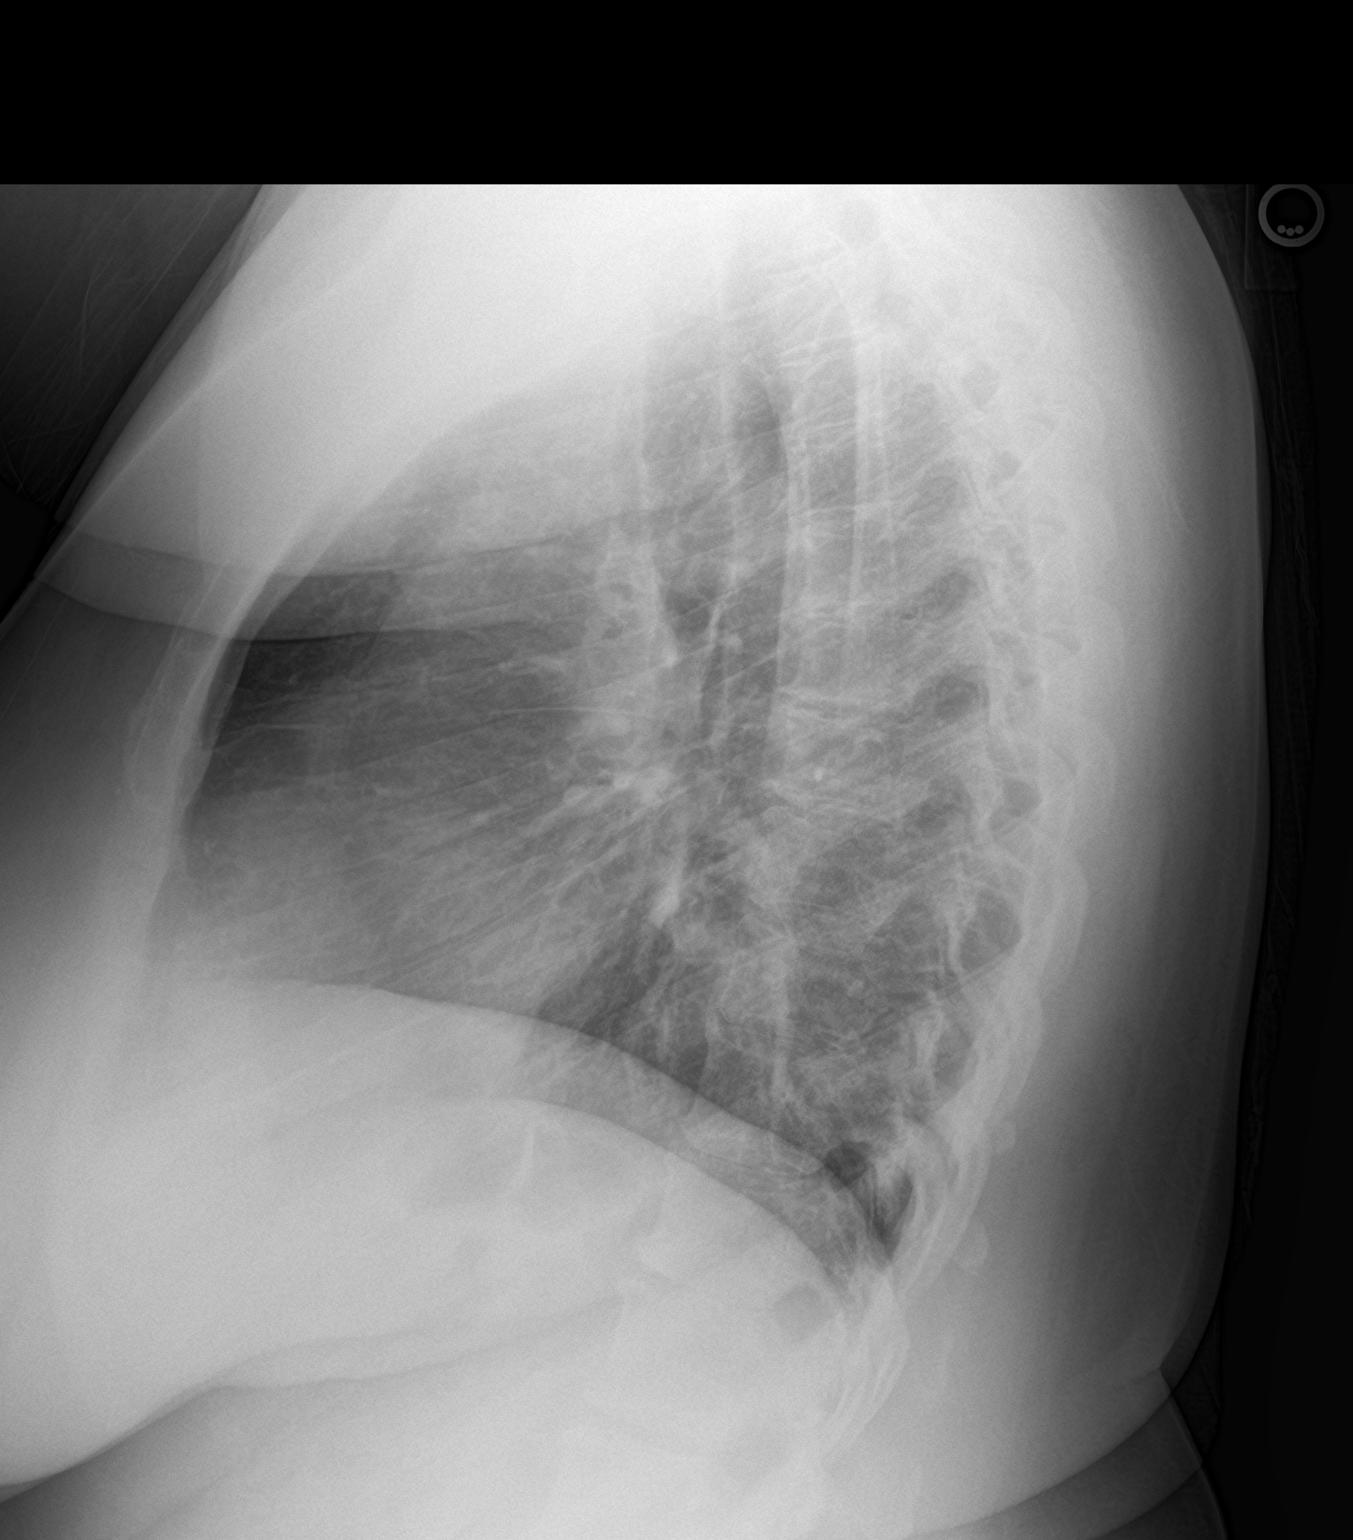

[2 of 2 positions shown; findings below may reference images not displayed]

FINDINGS: The heart size and mediastinal contours are within normal limits.
Both lungs are clear. The visualized skeletal structures are
unremarkable.
IMPRESSION: Normal exam.

## 2019-10-17 ENCOUNTER — Encounter (HOSPITAL_COMMUNITY): Payer: Self-pay | Admitting: Emergency Medicine

## 2019-10-17 ENCOUNTER — Ambulatory Visit (HOSPITAL_COMMUNITY)
Admission: EM | Admit: 2019-10-17 | Discharge: 2019-10-17 | Disposition: A | Discharge status: Home / Self Care | Payer: Managed Care, Other (non HMO) | Attending: Family Medicine | Admitting: Family Medicine

## 2019-10-17 ENCOUNTER — Other Ambulatory Visit: Payer: Self-pay

## 2019-10-17 DIAGNOSIS — M549 Dorsalgia, unspecified: Secondary | ICD-10-CM | POA: Diagnosis not present

## 2019-10-17 DIAGNOSIS — R109 Unspecified abdominal pain: Secondary | ICD-10-CM

## 2019-10-17 LAB — POCT URINALYSIS DIPSTICK, ED / UC
Bilirubin Urine: NEGATIVE
Glucose, UA: NEGATIVE mg/dL
Ketones, ur: NEGATIVE mg/dL
Nitrite: NEGATIVE
Protein, ur: NEGATIVE mg/dL
Specific Gravity, Urine: 1.025 (ref 1.005–1.030)
Urobilinogen, UA: 0.2 mg/dL (ref 0.0–1.0)
pH: 6.5 (ref 5.0–8.0)

## 2019-10-17 NOTE — ED Triage Notes (Signed)
Pt c/o RLQ abd pain radiating to her back x this morning around 6:30 am. Pt states this is a recurrent problem. She states she has some pressure with urination. She denies vaginal discharge or bleeding. BMs have been normal.

## 2019-10-18 NOTE — ED Provider Notes (Signed)
[facility]    CSN: [id number] Arrival date & time: 10/17/19  1749      History   Chief Complaint Chief Complaint  Patient presents with  . Abdominal Pain  . Back Pain    HPI Isabel Pierce is a 54 y.o. female.   Here today with 1 day of RLQ, mild urinary frequency and right flank pain that has come and gone since onset and is about 8/10 when present. States this has been ongoing for about a year now, no obvious trigger to it or relieving factors. Denies constipation, N/V/D, fever, chills, dysuria, hematuria, vaginal discharge, concern for STIs. Does feel extra gassy right now. States she's had a CT scan and numerous examinations for this issue, scheduled to see GI for this in about a month as well. Currently using OTC pain relievers without much relief.      Past Medical History:  Diagnosis Date  . Hypertension     There are no problems to display for this patient.   Past Surgical History:  Procedure Laterality Date  . HAND TENDON SURGERY    . TUBAL LIGATION      OB History   No obstetric history on file.      Home Medications    Prior to Admission medications   Medication Sig Start Date End Date Taking? Authorizing Provider  amLODipine (NORVASC) 5 MG tablet Take 5 mg by mouth daily.   Yes [provider]  carvedilol (COREG) 12.5 MG tablet Take 12.5 mg by mouth 2 (two) times daily with a meal.   Yes [provider]  fluticasone (FLONASE) 50 MCG/ACT nasal spray Place into both nostrils daily.   Yes [provider]  hydrochlorothiazide (HYDRODIURIL) 12.5 MG tablet Take 12.5 mg by mouth daily.   Yes [provider]  ibuprofen (ADVIL) 800 MG tablet Take 800 mg by mouth every 8 (eight) hours as needed.   Yes [provider]  ipratropium (ATROVENT) 0.06 % nasal spray Place 2 sprays into both nostrils 4 (four) times daily. 07/29/19  Yes Yu, Amy V, PA-C  benzonatate (TESSALON) 200 MG capsule Take 1 capsule (200 mg  total) by mouth every 8 (eight) hours. 07/29/19   Cathie Hoops, Amy V, PA-C  naproxen (NAPROSYN) 500 MG tablet Take 1 tablet (500 mg total) by mouth 2 (two) times daily. 05/16/19   Hall-Potvin, [place], PA-C  ranitidine (ZANTAC) 150 MG tablet Take 150 mg by mouth 2 (two) times daily.  11/29/18  [provider]    Family History Family History  Problem Relation Age of Onset  . Diabetes Mother   . Hypertension Mother   . Seizures Mother   . Cancer Father     Social History Social History   Tobacco Use  . Smoking status: Never Smoker  . Smokeless tobacco: Never Used  Vaping Use  . Vaping Use: Never assessed  Substance Use Topics  . Alcohol use: Yes    Comment: occ  . Drug use: Never     Allergies   Augmentin [amoxicillin-pot clavulanate], Contrast media [iodinated diagnostic agents], and Lisinopril   Review of Systems Review of Systems PER HPI   Physical Exam Triage Vital Signs ED Triage Vitals  Enc Vitals Group     BP 10/17/19 2001 (!) 133/91     Pulse Rate 10/17/19 2001 (!) 59     Resp --      Temp 10/17/19 2001 97.7 F (36.5 C)     Temp Source 10/17/19 2001 Tympanic  SpO2 10/17/19 2001 99 %     Weight --      Height --      Head Circumference --      Peak Flow --      Pain Score 10/17/19 1959 7     Pain Loc --      Pain Edu? --      Excl. in GC? --    No data found.  Updated Vital Signs BP (!) 133/91 (BP Location: Right Arm)   Pulse (!) 59   Temp 97.7 F (36.5 C) (Tympanic)   SpO2 99%   Visual Acuity Right Eye Distance:   Left Eye Distance:   Bilateral Distance:    Right Eye Near:   Left Eye Near:    Bilateral Near:     Physical Exam Vitals and nursing note reviewed.  Constitutional:      Appearance: Normal appearance. She is not ill-appearing.  HENT:     Head: Atraumatic.  Eyes:     Extraocular Movements: Extraocular movements intact.     Conjunctiva/sclera: Conjunctivae normal.  Cardiovascular:     Rate and Rhythm: Normal rate  and regular rhythm.     Heart sounds: Normal heart sounds.  Pulmonary:     Effort: Pulmonary effort is normal.     Breath sounds: Normal breath sounds.  Abdominal:     General: Bowel sounds are normal. There is no distension.     Palpations: Abdomen is soft.     Tenderness: There is abdominal tenderness (mild ttp suprapubic area). There is right CVA tenderness (minimal ). There is no left CVA tenderness, guarding or rebound.  Musculoskeletal:        General: Normal range of motion.     Cervical back: Normal range of motion and neck supple.  Skin:    General: Skin is warm and dry.  Neurological:     Mental Status: She is alert and oriented to person, place, and time.  Psychiatric:        Mood and Affect: Mood normal.        Thought Content: Thought content normal.        Judgment: Judgment normal.    UC Treatments / Results  Labs (all labs ordered are listed, but only abnormal results are displayed) Labs Reviewed  POCT URINALYSIS DIPSTICK, ED / UC - Abnormal; Notable for the following components:      Result Value   Hgb urine dipstick TRACE (*)    Leukocytes,Ua TRACE (*)    All other components within normal limits    EKG   Radiology No results found.  Procedures Procedures (including critical care time)  Medications Ordered in UC Medications - No data to display  Initial Impression / Assessment and Plan / UC Course  I have reviewed the triage vital signs and the nursing notes.  Pertinent labs & imaging results that were available during my care of the patient were reviewed by me and considered in my medical decision making (see chart for details).     U/A today without clear evidence of UTI but does reveal some RBCs - unsure if this represents a kidney stone at this time without further imaging/eval. Her pain is currently under good control and coming in short waves without triggers. Discussed OTC pain relievers, keeping a log to track patterns of triggers, and f/u  with PCP and GI for further evaluation. ED precautions reviewed.    Final Clinical Impressions(s) / UC Diagnoses   Final diagnoses:  Right flank pain   Discharge Instructions   None    ED Prescriptions    None     PDMP not reviewed this encounter.   Particia Nearing, [place] 10/18/19 2023

## 2020-03-24 LAB — COMPREHENSIVE METABOLIC PANEL
ALT: 11 U/L (ref 0–33)
AST: 14 U/L (ref 0–32)
Albumin/Globulin Ratio: 1 mmol/L (ref 1.00–2.70)
Albumin: 4.2 g/dL (ref 3.5–5.2)
Alk Phosphatase: 91 U/L (ref 35–117)
Anion Gap: 11 mmol/L (ref 2–17)
BUN: 15 mg/dL (ref 6–20)
CO2: 24 mmol/L (ref 22–29)
Calcium: 9.5 mg/dL (ref 8.6–10.0)
Chloride: 99 mmol/L (ref 98–107)
Creatinine: 0.8 mg/dL (ref 0.5–1.0)
GFR African American: 98 mL/min/{1.73_m2} (ref >=90)
GFR Non-African American: 85 mL/min/{1.73_m2} — ABNORMAL LOW (ref >=90)
Globulin: 4.2 g/dL (ref 1.9–4.4)
Glucose: 95 mg/dL (ref 70–99)
OSMOLALITY CALCULATED: 269 mOsm/kg — ABNORMAL LOW (ref 270–287)
Potassium: 3.9 mmol/L (ref 3.5–5.3)
Sodium: 134 mmol/L — ABNORMAL LOW (ref 135–145)
Total Bilirubin: 0.32 mg/dL (ref 0.00–1.20)
Total Protein: 8.4 g/dL — ABNORMAL HIGH (ref 6.4–8.3)

## 2020-03-24 LAB — MICROSCOPIC URINALYSIS
Amorphous, UA: NONE SEEN /HPF
MUCUS, URINE: NONE SEEN /LPF
RBC, UA: NONE SEEN /HPF (ref 0–2)

## 2020-03-24 LAB — CBC WITH AUTO DIFFERENTIAL
Absolute Baso #: 0 10*3/uL (ref 0.0–0.2)
Absolute Eos #: 0 10*3/uL (ref 0.0–0.5)
Absolute Lymph #: 2.4 10*3/uL (ref 1.0–3.2)
Absolute Mono #: 0.6 10*3/uL (ref 0.3–1.0)
Basophils %: 0.3 % (ref 0.0–2.0)
Eosinophils %: 0.3 % (ref 0.0–7.0)
Hematocrit: 40.1 % (ref 34.0–47.0)
Hemoglobin: 13.1 g/dL (ref 11.5–15.7)
Immature Grans (Abs): 0.01 10*3/uL (ref 0.00–0.06)
Immature Granulocytes: 0.2 % (ref 0.0–0.6)
Lymphocytes: 37.6 % (ref 15.0–45.0)
MCH: 28.9 pg (ref 27.0–34.5)
MCHC: 32.7 g/dL (ref 32.0–36.0)
MCV: 88.5 fL (ref 81.0–99.0)
MPV: 9.3 fL (ref 7.2–13.2)
Monocytes: 9.6 % (ref 4.0–12.0)
Neutrophils %: 52 % (ref 42.0–74.0)
Neutrophils Absolute: 3.3 10*3/uL (ref 1.6–7.3)
Platelets: 255 10*3/uL (ref 140–440)
RBC: 4.53 x10e6/mcL (ref 3.60–5.20)
RDW: 14.1 % (ref 11.0–16.0)
WBC: 6.3 10*3/uL (ref 3.8–10.6)

## 2020-03-24 LAB — URINALYSIS W/ RFLX MICROSCOPIC
Bilirubin Urine: NEGATIVE
Blood, Urine: NEGATIVE
Glucose, UA: NEGATIVE mg/dL
Ketones, Urine: NEGATIVE mg/dL
Nitrite, Urine: NEGATIVE
Protein, UA: NEGATIVE
Specific Gravity, UA: 1.02 (ref 1.003–1.035)
Urobilinogen, Urine: 0.2 EU/dL
pH, UA: 6 (ref 4.5–8.0)

## 2020-03-24 LAB — LIPASE: Lipase: 79 U/L — ABNORMAL HIGH (ref 13–60)

## 2020-03-24 LAB — MAGNESIUM: Magnesium: 2.1 mg/dL (ref 1.6–2.6)

## 2020-03-24 LAB — COVID-19: SARS-CoV-2: NOT DETECTED

## 2020-03-24 NOTE — ED Notes (Signed)
ED Triage Note       ED Secondary Triage Entered On:  03/24/2020 13:01 EST    Performed On:  03/24/2020 12:55 EST by Laverle Patter E-RN               General Information   Barriers to Learning :   None evident   Languages :   English   Interpreter Called :   No   Last Tetanus :   Unknown   Immunizations Current :   Yes   Influenza Vaccine Status :   Not received   Pt. Currently Receiving Radiation :   No   COVID-19 Vaccine Status :   2 Doses received   2 Doses Received Manufacturer :   Pfizer vaccine   ED Home Meds Section :   Document assessment   [facility] ED Fall Risk Section :   Document assessment   ED History Section :   Document assessment   ED Advance Directives Section :   Document assessment   ED Palliative Screen :   N/A (prefilled for <65yo)   Laverle Patter E-RN - 03/24/2020 12:55 EST   (As Of: 03/24/2020 13:01:29 EST)   Problems(Active)    Hypertension (SNOMED CT  :2956213086 )  Name of Problem:   Hypertension ; Recorder:   Ohlinger,  Hailey E-RN; Confirmation:   Confirmed ; Classification:   Patient Stated ; Code:   [id number] ; Contributor System:   Dietitian ; Last Updated:   03/24/2020 12:58 EST ; Life Cycle Date:   03/24/2020 ; Life Cycle Status:   Active ; Vocabulary:   SNOMED CT          Diagnoses(Active)    Abdominal pain  Date:   03/24/2020 ; Diagnosis Type:   Reason For Visit ; Confirmation:   Complaint of ; Clinical Dx:   Abdominal pain ; Classification:   Medical ; Clinical Service:   Emergency medicine ; Code:   PNED ; Probability:   0 ; Diagnosis Code:   [id number]             -    Procedure History   (As Of: 03/24/2020 13:01:29 EST)     Anesthesia Minutes:   0 ; Procedure Name:   Tubal ligation ; Procedure Minutes:   0 ; Comments:     03/24/2020 12:59 EST - Laverle Patter E-RN  2003 ; Last Reviewed Dt/Tm:   03/24/2020 13:01:25 EST            Anesthesia Minutes:   0 ; Procedure Name:   Ablation ; Procedure Minutes:   0 ; Comments:     03/24/2020 13:00 EST - Laverle Patter  E-RN  2009 ; Last Reviewed Dt/Tm:   03/24/2020 13:01:25 EST            Anesthesia Minutes:   0 ; Procedure Name:   Hand tendon repaired ; Procedure Minutes:   0 ; Comments:     03/24/2020 13:01 EST - Laverle Patter E-RN  2018 ; Last Reviewed Dt/Tm:   03/24/2020 13:01:25 EST            [facility] Fall Risk Assessment Tool   Hx of falling last 3 months ED Fall :   No   Patient confused or disoriented ED Fall :   No   Patient intoxicated or sedated ED Fall :   No   Patient impaired gait ED Fall :   No   Use a mobility  assistance device ED Fall :   No   Patient altered elimination ED Fall :   No   [facility] ED Fall Score :   0    Laverle Patter E-RN - 03/24/2020 12:55 EST   ED Advance Directive   Advance Directive :   No   Laverle Patter E-RN - 03/24/2020 12:55 EST   Social History   Social History   (As Of: 03/24/2020 13:01:29 EST)   Tobacco:        Tobacco use: Never (less than 100 in lifetime).   (Last Updated: 03/24/2020 12:56:58 EST by Laverle Patter E-RN)          Alcohol:        Current, Beer, 1-2 times per month   (Last Updated: 03/24/2020 12:57:19 EST by Laverle Patter E-RN)          Substance Use:        Denies   (Last Updated: 03/24/2020 12:57:28 EST by Laverle Patter E-RN)

## 2020-03-24 NOTE — ED Provider Notes (Signed)
General Medical Problem *ED        Patient:   Lindsey Bradford, Lindsey Bradford             MRN: [id number]            FIN: [id number]               Age:   55 years     Sex:  Female     DOB:  [birth year]   Associated Diagnoses:   Abdominal pain   Author:   Karoline Caldwell K-MD      Basic Information   Time seen: Provider Seen (ST)   ED Provider/Time:    Karoline Caldwell K-MD / 03/24/2020 12:35  .   Additional information: Chief Complaint from Nursing Triage Note   Chief Complaint  Chief Complaint: Patient presents with abd pain that started over the weekend, Patient states sharp constant pain. 2020 "kink in colon". Patient states pain 7/10. (03/24/20 12:45:00).      History of Present Illness   55 year old female patient with a past medical history that includes hypertension and SBO in the past, presenting with on and off abdominal pain that started about 2 months ago, nothing makes it better, nothing makes it worse, not traveling anywhere, all over the abdomen, seem to be more frequent and persistent over the past 2 to 3 days, mild to moderate in severity.  No nausea no vomiting no diarrhea no chest pain no shortness of breath no cough no dysuria no hematuria no flank pain no any other complaints      Review of Systems             Additional review of systems information: All other systems reviewed and otherwise negative.      Health Status   Allergies:    Allergic Reactions (Selected)  Moderate  Augmentin- Diarrhea.  Contrast media (iodine-based)- Hives.  Lisinopril- Cough..      Past Medical/ Family/ Social History   Medical history: Reviewed as documented in chart.   Surgical history: Reviewed as documented in chart.   Family history: Not significant.   Social history: Reviewed as documented in chart.   Problem list:    Active Problems (1)  Hypertension   .   Additional Past History: Past medical history: Includes hypertension otherwise denies any significant past medical history    Social history: denies smoking denies  substance abuse denies EtOH abuse.      Physical Examination               Vital Signs   Vital Signs   03/24/2020 15:33 EST Systolic Blood Pressure 145 mmHg  HI    Diastolic Blood Pressure 88 mmHg    Heart Rate Monitored 83 bpm    Respiratory Rate 16 br/min    SpO2 100 %   03/24/2020 15:00 EST Systolic Blood Pressure 154 mmHg  HI    Diastolic Blood Pressure 95 mmHg  HI    Heart Rate Monitored 73 bpm    Respiratory Rate 12 br/min    SpO2 99 %   03/24/2020 14:34 EST Systolic Blood Pressure 154 mmHg  HI    Diastolic Blood Pressure 96 mmHg  HI    Heart Rate Monitored 79 bpm    Respiratory Rate 15 br/min    SpO2 100 %   03/24/2020 13:55 EST Systolic Blood Pressure 151 mmHg  HI    Diastolic Blood Pressure 88 mmHg    Heart Rate Monitored 70 bpm  Respiratory Rate 16 br/min    SpO2 100 %   03/24/2020 13:02 EST Respiratory Rate 16 br/min   03/24/2020 12:45 EST Systolic Blood Pressure 143 mmHg  HI    Diastolic Blood Pressure 92 mmHg  HI    Temperature Oral 36.7 degC    Heart Rate Monitored 72 bpm    Respiratory Rate 16 br/min    SpO2 100 %   .   Measurements   03/24/2020 12:55 EST Body Mass Index est meas 43.95 kg/m2    Body Mass Index Measured 43.95 kg/m2   03/24/2020 12:45 EST Height/Length Measured 165.1 cm    Weight Dosing 119.8 kg   .   General appearance: Nontoxic-appearing, in no acute distress  Head: Normocephalic, atraumatic  ENT: Normal external exam of the nose and ears, no evidence of trauma  Neck: Atraumatic, no stridor, no gross adenopathy  Respiratory: In no acute distress, clear to auscultation bilaterally, no wheezing, no crackles  Cardiovascular: Hemodynamically stable, intact pulses all over, intact cap refill all over  Abdomen: Unremarkable inspection, soft, nontender, nondistended, normoactive bowel sounds, no rebound no guarding  Musculoskeletal: Atraumatic, no deformity, intact neurovascular examination All over  CNS: Grossly nonfocal exam,, normal speech  Skin: Warm and dry  Psych: Calm and cooperative          Medical Decision Making   Patient declined IV contrast and also declined to finish the oral contrast, risks and benefits were thoroughly explained to the patient and the patient has capacity to make decisions for herself.      Impression and Plan   Diagnosis   Abdominal pain (ICD10-CM R10.9, Discharge, Medical)   Plan   Condition: Stable.    Disposition: Discharged: Time  03/24/2020 16:04:00, to home.    Prescriptions: Launch prescriptions   Pharmacy:  Bentyl 10 mg oral capsule (Prescribe): 10 mg, 1 caps, Oral, TID, for 7 days, PRN: Abdominal cramping, 10 caps, 0 Refill(s).    Patient was given the following educational materials: Abdominal Pain, Adult.    Follow up with: Primary care or clinic Within 1 to 2 days Please follow-up with your primary care doctor, if you do not have a primary care doctor please see attached discharge instructions to find help in finding a primary care doctor/clinic.  Return to ED if symptoms worsen; [facility] Within 1 to 2 days Return to ED if symptoms worsen.    Counseled: Patient, Regarding diagnosis, Regarding diagnostic results, Regarding treatment plan, Patient indicated understanding of instructions.    Signature Line     Electronically Signed on 03/25/2020 11:35 AM EST   ________________________________________________   Karoline Caldwell K-MD               Modified by: Karoline Caldwell K-MD on 03/24/2020 04:03 PM EST      Modified by: Karoline Caldwell K-MD on 03/24/2020 04:04 PM EST

## 2020-03-24 NOTE — ED Notes (Signed)
ED Patient Education Note     ;Patient Education Materials Follows:             [facility]  [address] [place], [place]  [address]  [phone]  Emergency Department  Excuse from Work, School, or Physical Activity    Lindsey Bradford was seen in the Emergency     Department today, and may return 03/25/2020    Health Care Provider Name (printed): Laverle Patter, RN  Health Care Provider (signature): ___________________________________________      Date: 03/24/2020  This information is not intended to replace advice given to you by your health care provider. Make sure you discuss any questions you have with your health care provider.                Gastroenterology     Abdominal Pain, Adult    Pain in the abdomen (abdominal pain) can be caused by many things. Often, abdominal pain is not serious and it gets better with no treatment or by being treated at home. However, sometimes abdominal pain is serious.    Your health care provider will ask questions about your medical history and do a physical exam to try to determine the cause of your abdominal pain.      Follow these instructions at home:    Medicines     Take over-the-counter and prescription medicines only as told by your health care provider.     Do not take a laxative unless told by your health care provider.      General instructions       Watch your condition for any changes.     Drink enough fluid to keep your urine pale yellow.     Keep all follow-up visits as told by your health care provider. This is important.        Contact a health care provider if:     Your abdominal pain changes or gets worse.     You are not hungry or you lose weight without trying.     You are constipated or have diarrhea for more than 2?3 days.     You have pain when you urinate or have a bowel movement.     Your abdominal pain wakes you up at night.     Your pain gets worse with meals, after eating, or with certain foods.     You are vomiting and  cannot keep anything down.     You have a fever.     You have blood in your urine.      Get help right away if:     Your pain does not go away as soon as your health care provider told you to expect.     You cannot stop vomiting.     Your pain is only in areas of the abdomen, such as the right side or the left lower portion of the abdomen. Pain on the right side could be caused by appendicitis.     You have bloody or black stools, or stools that look like tar.     You have severe pain, cramping, or bloating in your abdomen.     You have signs of dehydration, such as:  ? Dark urine, very little urine, or no urine.    ? Cracked lips.    ? Dry mouth.    ? Sunken eyes.    ? Sleepiness.    ? Weakness.  You have trouble breathing or chest pain.      Summary     Often, abdominal pain is not serious and it gets better with no treatment or by being treated at home. However, sometimes abdominal pain is serious.     Watch your condition for any changes.     Take over-the-counter and prescription medicines only as told by your health care provider.     Contact a health care provider if your abdominal pain changes or gets worse.     Get help right away if you have severe pain, cramping, or bloating in your abdomen.      This information is not intended to replace advice given to you by your health care provider. Make sure you discuss any questions you have with your health care provider.      Document Revised: 02/23/2019 Document Reviewed: 05/15/2018  Elsevier Patient Education ? 2021 Elsevier Inc.

## 2020-03-24 NOTE — ED Notes (Signed)
 ED Triage Note       ED Triage Adult Entered On:  03/24/2020 12:55 EST    Performed On:  03/24/2020 12:45 EST by Sydnee Ming E-RN               Triage   Numeric Rating Pain Scale :   7   Chief Complaint :   Patient presents with abd pain that started over the weekend, Patient states sharp constant pain. 2020 kink in colon. Patient states pain 7/10.    [place] Mode of Arrival :   Private vehicle   Infectious Disease Documentation :   Document assessment   Patient received chemo or biotherapy last 48 hrs? :   No   Temperature Oral :   36.7 degC(Converted to: 98.1 degF)    Heart Rate Monitored :   72 bpm   Respiratory Rate :   16 br/min   Systolic Blood Pressure :   143 mmHg (HI)    Diastolic Blood Pressure :   92 mmHg (HI)    SpO2 :   100 %   Oxygen Therapy :   Room air   Patient presentation :   None of the above   Chief Complaint or Presentation suggest infection :   No   Weight Dosing :   119.8 kg(Converted to: 264 lb 2 oz)    Height :   165.1 cm(Converted to: 5 ft 5 in)    Body Mass Index Dosing :   44 kg/m2   Sydnee Ming E-RN - 03/24/2020 12:45 EST   DCP GENERIC CODE   Tracking Acuity :   3   Tracking Group :   ED [employer]   Sydnee Ming E-RN - 03/24/2020 12:45 EST   ED General Section :   Document assessment   Pregnancy Status :   Patient denies   ED Allergies Section :   Document assessment   ED Reason for Visit Section :   Document assessment   ED Home Meds Section :   Document assessment   ED Quick Assessment :   Patient appears awake, alert, oriented to baseline. Skin warm and dry. Moves all extremities. Respiration even and unlabored. Appears in no apparent distress.   Sydnee Ming E-RN - 03/24/2020 12:45 EST   ID Risk Screen Symptoms   Recent Travel History :   No recent travel   Close Contact with COVID-19 ID :   No   Last 14 days COVID-19 ID :   No   TB Symptom Screen :   No symptoms   C. diff Symptom/History ID :   Neither of the above   Sydnee Ming E-RN - 03/24/2020  12:45 EST   Allergies   (As Of: 03/24/2020 12:55:33 EST)   Allergies (Active)   Augmentin  Estimated Onset Date:   Unspecified ; Reactions:   Diarrhea ; Created By:   Sydnee Ming E-RN; Reaction Status:   Active ; Category:   Drug ; Substance:   Augmentin ; Type:   Allergy ; Severity:   Moderate ; Updated By:   Sydnee Ming MURRAY; Reviewed Date:   03/24/2020 12:52 EST      contrast media (iodine-based)  Estimated Onset Date:   Unspecified ; Reactions:   Hives ; Comments:     Comment 1: Patient states she an take PO contrast without complications. Patient states allergy is to IV contrast.   ; Created By:  Ohlinger,  Hailey E-RN; Reaction Status:   Active ; Category:   Drug ; Substance:   contrast media (iodine-based) ; Type:   Allergy ; Severity:   Moderate ; Updated By:   Sydnee Norlene MURRAY; Reviewed Date:   03/24/2020 12:52 EST      lisinopril  Estimated Onset Date:   Unspecified ; Reactions:   Cough ; Created By:   Sydnee Norlene E-RN; Reaction Status:   Active ; Category:   Drug ; Substance:   lisinopril ; Type:   Allergy ; Severity:   Moderate ; Updated By:   Sydnee Norlene MURRAY; Reviewed Date:   03/24/2020 12:52 EST        Psycho-Social   Last 3 mo, thoughts killing self/others :   Patient denies   Right click within box for Suspected Abuse policy link. :   None   Feels Safe Where Live :   Yes   ED Behavioral Activity Rating Scale :   4 - Quiet and awake (normal level of activity)   Ohlinger,  Hailey E-RN - 03/24/2020 12:45 EST   ED Home Med List   Medication List   (As Of: 03/24/2020 12:55:33 EST)   Home Meds    amLODIPine  :   amLODIPine ; Status:   Documented ; Ordered As Mnemonic:   amLODIPine 5 mg oral tablet ; Simple Display Line:   TAKE ONE TABLET BY MOUTH ONE TIME DAILY ; Catalog Code:   amLODIPine ; Order Dt/Tm:   03/24/2020 12:53:17 EST          carvedilol  :   carvedilol ; Status:   Documented ; Ordered As Mnemonic:   carvedilol 12.5 mg oral tablet ; Simple Display Line:   TAKE ONE TABLET BY  MOUTH TWICE A DAY WITH A MEAL ; Catalog Code:   carvedilol ; Order Dt/Tm:   03/24/2020 12:53:17 EST          pantoprazole  :   pantoprazole ; Status:   Documented ; Ordered As Mnemonic:   Protonix ; Simple Display Line:   Oral, Daily, 0 Refill(s) ; Catalog Code:   pantoprazole ; Order Dt/Tm:   03/24/2020 12:53:30 EST            ED Reason for Visit   (As Of: 03/24/2020 12:55:33 EST)   Diagnoses(Active)    Abdominal pain  Date:   03/24/2020 ; Diagnosis Type:   Reason For Visit ; Confirmation:   Complaint of ; Clinical Dx:   Abdominal pain ; Classification:   Medical ; Clinical Service:   Emergency medicine ; Code:   PNED ; Probability:   0 ; Diagnosis Code:   [id number]

## 2020-03-24 NOTE — ED Notes (Signed)
 ED Patient Summary       ;       [facility]  [address], [place] [address]  [phone]  Discharge Instructions (Patient)  Name: Lindsey Bradford, Lindsey Bradford  DOB:  1965/08/04                   MRN: [id number]                   FIN: [id number]  Reason For Visit: Abdominal pain; ABD PAIN  Final Diagnosis: Abdominal pain     Visit Date: 03/24/2020 12:33:00  Address: [address] [place] [address]  Phone: [phone]     Emergency Department Providers:         Primary Physician:      ALOIS CHALMERS POUR      [facility] would like to thank you for allowing us  to assist you with your healthcare needs. The following includes patient education materials and information regarding your injury/illness.     Follow-up Instructions:  You were seen today on an emergency basis. Please contact your primary care doctor for a follow up appointment. If you received a referral to a specialist doctor, it is important you follow-up as instructed.    It is important that you call your follow-up doctor to schedule and confirm the location of your next appointment. Your doctor may practice at multiple locations. The office location of your follow-up appointment may be different to the one written on your discharge instructions.    If you do not have a primary care doctor, please call [phone] for help in finding a [facility] Provider. For help in finding a specialist doctor, please call [phone].    If your condition gets worse before your follow-up with your primary care doctor or specialist, please return to the Emergency Department.      Coronavirus 2019 (COVID-19) Reminders:     Patients age 10 - 84, with parental consent, and patients over age 57 can make an appointment for a COVID-19 vaccine. Patients can contact their [facility] doctors' offices to schedule an appointment to receive the COVID-19 vaccine. Patients who do not  have a Lindsey Bradford physician can call [phone] to schedule vaccination appointments.      Follow Up Appointments:  Primary Care Provider:     Name: PCP,  UNKNOWN     Phone:                   With: Address: When:   [facility] Call for appt & office location   [phone] Business (1) Within 1 to 2 days   Comments:   Return to ED if symptoms worsen       With: Address: When:   Primary care or clinic  Within 1 to 2 days   Comments:   Please follow-up with your primary care doctor, if you do not have a primary care doctor please see attached discharge instructions to find help in finding a primary care doctor/clinic.   Return to ED if symptoms worsen                   New Medications  Printed Prescriptions  dicyclomine (Bentyl 10 mg oral capsule) 1 Capsules Oral (given by mouth) 3 times a day as needed Abdominal cramping for 7 Days. Refills: 0.,  THIS MEDICATION IS ASSOCIATED  WITH  AN INCREASED RISK OF FALLS.  Last Dose:____________________  Medications that have not changed  Other Medications  amLODIPine (amLODIPine 5 mg oral tablet) TAKE ONE TABLET BY MOUTH ONE TIME DAILY.  Last Dose:____________________  carvedilol (carvedilol 12.5 mg oral tablet) TAKE ONE TABLET BY MOUTH TWICE A DAY WITH A MEAL.  Last Dose:____________________  pantoprazole (Protonix) Oral (given by mouth) every day.  Last Dose:____________________      Allergy Info: contrast media (iodine-based); Augmentin; lisinopril     Discharge Additional Information          Discharge Patient 03/24/20 16:04:00 EST      Patient Education Materials:                [facility] [place]  [address], [place], [place]  [address]  [phone]  Emergency Department  Excuse from Work, School, or Physical Activity    [name] was seen in the Emergency     Department today, and may return 03/25/2020    Health Care Provider Name (printed): Norlene Dubois, RN  Health Care Provider (signature):  ___________________________________________      Date: 03/24/2020  This information is not intended to replace advice given to you by your health care provider. Make sure you discuss any questions you have with your health care provider.                 Abdominal Pain, Adult    Pain in the abdomen (abdominal pain) can be caused by many things. Often, abdominal pain is not serious and it gets better with no treatment or by being treated at home. However, sometimes abdominal pain is serious.    Your health care provider will ask questions about your medical history and do a physical exam to try to determine the cause of your abdominal pain.      Follow these instructions at home:    Medicines     Take over-the-counter and prescription medicines only as told by your health care provider.     Do not take a laxative unless told by your health care provider.      General instructions       Watch your condition for any changes.     Drink enough fluid to keep your urine pale yellow.     Keep all follow-up visits as told by your health care provider. This is important.        Contact a health care provider if:     Your abdominal pain changes or gets worse.     You are not hungry or you lose weight without trying.     You are constipated or have diarrhea for more than 2?3 days.     You have pain when you urinate or have a bowel movement.     Your abdominal pain wakes you up at night.     Your pain gets worse with meals, after eating, or with certain foods.     You are vomiting and cannot keep anything down.     You have a fever.     You have blood in your urine.      Get help right away if:     Your pain does not go away as soon as your health care provider told you to expect.     You cannot stop vomiting.     Your pain is only in areas of the abdomen, such as the right side or the left  lower portion of the abdomen. Pain on the right side could be caused by appendicitis.     You have bloody or black stools, or stools that  look like tar.     You have severe pain, cramping, or bloating in your abdomen.     You have signs of dehydration, such as:  ? Dark urine, very little urine, or no urine.    ? Cracked lips.    ? Dry mouth.    ? Sunken eyes.    ? Sleepiness.    ? Weakness.       You have trouble breathing or chest pain.      Summary     Often, abdominal pain is not serious and it gets better with no treatment or by being treated at home. However, sometimes abdominal pain is serious.     Watch your condition for any changes.     Take over-the-counter and prescription medicines only as told by your health care provider.     Contact a health care provider if your abdominal pain changes or gets worse.     Get help right away if you have severe pain, cramping, or bloating in your abdomen.      This information is not intended to replace advice given to you by your health care provider. Make sure you discuss any questions you have with your health care provider.      Document Revised: 02/23/2019 Document Reviewed: 05/15/2018  Elsevier Patient Education ? 2021 Elsevier Inc.      ---------------------------------------------------------------------------------------------------------------------  [facility]) encourages you to self-enroll in the [facility] Patient Portal.  [facility] Patient Portal will allow you to manage your personal health information securely from your own electronic device now and in the future.  To begin your Patient Portal enrollment process, please visit [email or web address] Click on "Sign up now" under [facility].  If you find that you need additional assistance on the [facility] Patient Portal or need a copy of your medical records, please call the [facility] Medical Records Office at [phone].  Comment:

## 2020-03-24 NOTE — Discharge Summary (Signed)
 ED Clinical Summary                      [facility]  [address]  [place], [place], [address]  [phone]           PERSON INFORMATION  Name: Lindsey Bradford, Lindsey Bradford Age:  55 Years DOB: 16-Jun-1965   Sex: Female Language: English PCP: PCP,  UNKNOWN   Marital Status:  Married Phone: [phone] Med Service: [facility]   MRN:  [id number] Acct# [id number] Arrival: 03/24/2020 12:33:00   Visit Reason: Abdominal pain; ABD PAIN Acuity: 3 LOS: 000 04:04   Address:      [address] [place] [address]  Diagnosis:      Abdominal pain  Printed Prescriptions:            Allergies      Augmentin (Diarrhea)      lisinopril (Cough)      contrast media (iodine-based) (Hives)      Medications Administered During Visit:                  Medication Dose Route   Sodium Chloride 0.9% 1000 mL IV Piggyback   diphenhydrAMINE 25 mg IV Push   methylPREDNISolone 125 mg IV Push   iopamidol 30 mL Oral       Patient Medication List:              amLODIPine (amLODIPine 5 mg oral tablet) TAKE ONE TABLET BY MOUTH ONE TIME DAILY.  carvedilol (carvedilol 12.5 mg oral tablet) TAKE ONE TABLET BY MOUTH TWICE A DAY WITH A MEAL.  dicyclomine (Bentyl 10 mg oral capsule) 1 Capsules Oral (given by mouth) 3 times a day as needed Abdominal cramping for 7 Days. Refills: 0.,  THIS MEDICATION IS ASSOCIATED  WITH  AN INCREASED RISK OF FALLS.  pantoprazole (Protonix) Oral (given by mouth) every day.       Major Tests and Procedures:  The following procedures and tests were performed during your ED visit.  COMMONPROCEDURES%>  COMMON PROCEDURESCOMMENTS%>          Laboratory Orders  Name Status Details   .UA Micro Completed Urine, Clean Catch, Stat, ST - Stat, Collected, 03/24/20 13:00:00 EST, Once, 03/24/20 13:00:00 EST, Nurse collect, 03/24/20 13:00:00 EST, ALMEHBASH,  ABDULLAH K-MD, Print label Y/N, [id number]   CBCDIFF Completed Blood, Stat, ST - Stat, 03/24/20 13:00:00 EST, 03/24/20 13:00:00 EST, Nurse  collect, ALMEHBASH,  ABDULLAH K-MD, Print label Y/N   CMP Completed Blood, Stat, ST - Stat, 03/24/20 13:00:00 EST, 03/24/20 13:00:00 EST, Nurse collect, ALMEHBASH,  ABDULLAH K-MD, Print label Y/N   [facility] Completed Nasopharyngeal Swab, Stat, ST - Stat, 03/24/20 13:00:00 EST, 03/24/20 13:00:00 EST, Nurse collect, ALMEHBASH,  ABDULLAH K-MD, Print label Y/N   Lipase Lvl Completed Blood, Stat, ST - Stat, 03/24/20 13:00:00 EST, 03/24/20 13:00:00 EST, Nurse collect, ALMEHBASH,  ABDULLAH K-MD, Print label Y/N   Mg Completed Blood, Stat, ST - Stat, 03/24/20 13:00:00 EST, 03/24/20 13:00:00 EST, Nurse collect, ALMEHBASH,  ABDULLAH K-MD, Print label Y/N   UA Rflx Mic Completed Urine, Clean Catch, Stat, ST - Stat, 03/24/20 13:00:00 EST, Once, 03/24/20 13:00:00 EST, Nurse collect, ALOIS MOTTO K-MD, Print label Y/N               Radiology Orders  Name Status Details   CT Abdomen Pelvis w/o Contrast Completed 03/24/20 13:17:00 EST, STAT 1 hour or less, Reason: ACR  Select, Reason: Abdominal pain, acute, nonlocalized, Transport Mode: STRETCHER, pp_set_radiology_subspecialty, [id number], 7, G1004, [place]               Patient Care Orders  Name Status Details   COVID-19 Status Ordered 03/24/20 13:00:16 EST, NOT VALID FOR pharmacy, laboratory, radiology., 03/24/20 13:00:16 EST, COVID-19 Not Detected   DC ISO Order/Icons Ordered 03/24/20 13:45:29 EST, 03/24/20 13:45:29 EST   Discharge Patient Ordered 03/24/20 16:04:00 EST   ED Assessment Adult Completed 03/24/20 12:55:34 EST, 03/24/20 12:55:34 EST   ED Secondary Triage Completed 03/24/20 12:55:34 EST, 03/24/20 12:55:34 EST   ED Triage Adult Completed 03/24/20 12:33:18 EST, 03/24/20 12:33:18 EST   Notify Provider Completed 03/24/20 13:00:16 EST, This message can only be seen by Nursing, it is not visible to Pharmacy, Laboratory, or Radiology., 03/24/20 13:00:16 EST   Patient Isolation Ordered 03/24/20 13:00:00 EST, Contact and Airborne, Constant Indicator           PROVIDER  INFORMATION              Provider Role Assigned Sampson GAMBLE, Trosky K-MD ED Provider 03/24/2020 12:35:36    Waylon Keneth Planas M-RN ED Nurse 03/24/2020 12:35:43        Attending Physician:  GAMBLE MOTTO K-MD    Admit Doc  GAMBLE MOTTO K-MD   Consulting Doc       VITALS INFORMATION  Vital Sign Triage Latest   Temp Oral ORAL_1%>36.7 degC ORAL%>37.0 degC   Temp Temporal TEMPORAL_1%> TEMPORAL%>   Temp Intravascular INTRAVASCULAR_1%> INTRAVASCULAR%>   Temp Axillary AXILLARY_1%> AXILLARY%>   Temp Rectal RECTAL_1%> RECTAL%>   02 Sat 100 % 100 %   Respiratory Rate RATE_1%>16 br/min RATE%>16 br/min   Peripheral Pulse Rate PULSE RATE_1%> PULSE RATE%>   Apical Heart Rate HEART RATE_1%> HEART RATE%>   Blood Pressure BLOOD PRESSURE_1%>/ BLOOD PRESSURE_1%>92 mmHg BLOOD PRESSURE%>154 mmHg / BLOOD PRESSURE%>105 mmHg              Immunizations      No Immunizations Documented This Visit       DISCHARGE INFORMATION   Discharge Disposition: H Outpt-Sent Home   Discharge Location:       Discharge Date and Time:    03/24/2020 16:37:24   ED Checkout Date and Time:    03/24/2020 16:37:24     DEPART REASON INCOMPLETE INFORMATION               Depart Action Incomplete Reason   Interactive View/I&O Recently assessed               Problems      No Problems Documented              Smoking Status      Never (less than 100 in lifetime)         PATIENT EDUCATION INFORMATION  Instructions:       [employer] Work / Technical sales engineer); Abdominal Pain, Adult     Follow up:                  With: Address: When:   [facility] Call for appt & office location   [phone] Business (1) Within 1 to 2 days   Comments:   Return to ED if symptoms worsen       With: Address: When:   Primary care or clinic  Within 1 to 2 days   Comments:   Please follow-up with your primary care doctor, if you do not  have a primary care doctor please see attached discharge instructions to find help in finding a primary care  doctor/clinic.   Return to ED if symptoms worsen           ED PROVIDER DOCUMENTATION
